# Patient Record
Sex: Female | Born: 1980 | Race: White | Hispanic: No | Smoking: Never smoker
Health system: Southern US, Community
[De-identification: ages and names within clinical notes are randomized; demographics above are authoritative.]

## PROBLEM LIST (undated history)

## (undated) DIAGNOSIS — G43909 Migraine, unspecified, not intractable, without status migrainosus: Secondary | ICD-10-CM

## (undated) HISTORY — PX: WISDOM TOOTH EXTRACTION: SHX21

## (undated) HISTORY — DX: Migraine, unspecified, not intractable, without status migrainosus: G43.909

---
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
## 2001-03-16 ENCOUNTER — Encounter: Payer: Self-pay | Admitting: Emergency Medicine

## 2001-03-16 ENCOUNTER — Emergency Department (HOSPITAL_COMMUNITY): Admission: EM | Admit: 2001-03-16 | Discharge: 2001-03-16 | Payer: Self-pay | Admitting: Emergency Medicine

## 2001-04-17 ENCOUNTER — Other Ambulatory Visit: Admission: RE | Admit: 2001-04-17 | Discharge: 2001-04-17 | Payer: Self-pay | Admitting: Obstetrics and Gynecology

## 2002-05-10 ENCOUNTER — Other Ambulatory Visit: Admission: RE | Admit: 2002-05-10 | Discharge: 2002-05-10 | Payer: Self-pay | Admitting: Obstetrics and Gynecology

## 2003-05-26 ENCOUNTER — Other Ambulatory Visit: Admission: RE | Admit: 2003-05-26 | Discharge: 2003-05-26 | Payer: Self-pay | Admitting: Obstetrics and Gynecology

## 2004-01-11 ENCOUNTER — Emergency Department (HOSPITAL_COMMUNITY): Admission: EM | Admit: 2004-01-11 | Discharge: 2004-01-11 | Payer: Self-pay | Admitting: Family Medicine

## 2004-05-31 ENCOUNTER — Other Ambulatory Visit: Admission: RE | Admit: 2004-05-31 | Discharge: 2004-05-31 | Payer: Self-pay | Admitting: Obstetrics and Gynecology

## 2005-01-26 ENCOUNTER — Encounter: Admission: RE | Admit: 2005-01-26 | Discharge: 2005-01-26 | Payer: Self-pay | Admitting: Family Medicine

## 2005-02-15 ENCOUNTER — Emergency Department (HOSPITAL_COMMUNITY): Admission: EM | Admit: 2005-02-15 | Discharge: 2005-02-15 | Payer: Self-pay | Admitting: Family Medicine

## 2005-06-02 ENCOUNTER — Other Ambulatory Visit: Admission: RE | Admit: 2005-06-02 | Discharge: 2005-06-02 | Payer: Self-pay | Admitting: Obstetrics and Gynecology

## 2008-08-28 ENCOUNTER — Inpatient Hospital Stay (HOSPITAL_COMMUNITY): Admission: AD | Admit: 2008-08-28 | Discharge: 2008-08-31 | Payer: Self-pay | Admitting: Obstetrics and Gynecology

## 2008-08-29 ENCOUNTER — Encounter (INDEPENDENT_AMBULATORY_CARE_PROVIDER_SITE_OTHER): Payer: Self-pay | Admitting: Obstetrics and Gynecology

## 2011-01-04 ENCOUNTER — Other Ambulatory Visit: Payer: Self-pay | Admitting: Obstetrics and Gynecology

## 2011-05-10 ENCOUNTER — Encounter: Payer: Self-pay | Admitting: Nurse Practitioner

## 2011-05-10 ENCOUNTER — Ambulatory Visit (INDEPENDENT_AMBULATORY_CARE_PROVIDER_SITE_OTHER): Payer: Commercial Managed Care - PPO | Admitting: Nurse Practitioner

## 2011-05-10 DIAGNOSIS — G43009 Migraine without aura, not intractable, without status migrainosus: Secondary | ICD-10-CM

## 2011-05-10 MED ORDER — SUMATRIPTAN SUCCINATE 100 MG PO TABS: 100.0000 mg | ORAL_TABLET | Freq: Once | ORAL | Status: DC | PRN

## 2011-05-10 MED ORDER — PROMETHAZINE HCL 25 MG PO TABS: 25.0000 mg | ORAL_TABLET | Freq: Four times a day (QID) | ORAL | Status: DC | PRN

## 2011-05-10 MED ORDER — ONDANSETRON 8 MG PO TBDP: 8.0000 mg | ORAL_TABLET | Freq: Three times a day (TID) | ORAL | Status: DC | PRN

## 2011-05-10 MED ORDER — HYDROCODONE-IBUPROFEN 7.5-200 MG PO TABS: 1.0000 | ORAL_TABLET | Freq: Three times a day (TID) | ORAL | Status: AC | PRN

## 2011-05-10 MED ORDER — ONDANSETRON 8 MG PO TBDP: 8.0000 mg | ORAL_TABLET | Freq: Three times a day (TID) | ORAL | Status: AC | PRN

## 2011-05-10 NOTE — Progress Notes (Signed)
  Subjective:    Patient ID: Gina Morgan A Rash, female    DOB: Jul 05, 1981, 30 y.o.   MRN: [id number]  HPIC/O migraine headache that have worsened in last 4 years. Located in right temple mostly. Has 1 severe every other month, 2 moderates and 2-3 milds per month.  Just using OTC meds now. Last migraine was very severe and she experienced nausea and vomiting and almost went to ER with pain. Currently works as Engineer, civil (consulting) at [employer]. Has 2 yo son. Cleta Alberts IUD for contraception.      Review of Systems  Constitutional: Negative.   Eyes: Negative.   Respiratory: Negative.   Cardiovascular: Negative.   Genitourinary: Negative.   Neurological: Positive for headaches.       Objective:   Physical Exam  Vitals reviewed. Constitutional: She is oriented to person, place, and time. She appears well-developed and well-nourished.  HENT:  Head: Normocephalic and atraumatic.  Eyes: Pupils are equal, round, and reactive to light.  Neck: Normal range of motion.  Cardiovascular: Normal rate and regular rhythm.   Pulmonary/Chest: Breath sounds normal.  Musculoskeletal: Normal range of motion.  Neurological: She is alert and oriented to person, place, and time.  Skin: Skin is warm and dry.  Psychiatric: She has a normal mood and affect. Her behavior is normal. Judgment and thought content normal.          Assessment & Plan:  A: migraine without aura P: She should do very well after migraine education and RX for Imitrex. For nausea zofran and phenergan. Rescue pain meds [facility] RTC one year or prn

## 2011-05-10 NOTE — Progress Notes (Signed)
Complains of frequent headaches

## 2011-06-17 LAB — CBC
HCT: 33.1 % — ABNORMAL LOW (ref 36.0–46.0)
HCT: 33.4 % — ABNORMAL LOW (ref 36.0–46.0)
Hemoglobin: 11.1 g/dL — ABNORMAL LOW (ref 12.0–15.0)
Hemoglobin: 11.1 g/dL — ABNORMAL LOW (ref 12.0–15.0)
MCHC: 33.5 g/dL (ref 30.0–36.0)
MCHC: 33.7 g/dL (ref 30.0–36.0)
MCV: 85.7 fL (ref 78.0–100.0)
MCV: 86.1 fL (ref 78.0–100.0)
Platelets: 229 10*3/uL (ref 150–400)
Platelets: 265 10*3/uL (ref 150–400)
RBC: 3.87 MIL/uL (ref 3.87–5.11)
RDW: 14.5 % (ref 11.5–15.5)
WBC: 10.5 10*3/uL (ref 4.0–10.5)
WBC: 15.2 10*3/uL — ABNORMAL HIGH (ref 4.0–10.5)

## 2011-06-17 LAB — SYPHILIS: RPR W/REFLEX TO RPR TITER AND TREPONEMAL ANTIBODIES, TRADITIONAL SCREENING AND DIAGNOSIS ALGORITHM: RPR Ser Ql: NONREACTIVE

## 2011-09-27 ENCOUNTER — Other Ambulatory Visit (HOSPITAL_COMMUNITY): Payer: Self-pay | Admitting: Family Medicine

## 2011-09-27 DIAGNOSIS — R1013 Epigastric pain: Secondary | ICD-10-CM

## 2011-09-30 ENCOUNTER — Other Ambulatory Visit (HOSPITAL_COMMUNITY): Payer: Commercial Managed Care - PPO

## 2011-10-04 ENCOUNTER — Ambulatory Visit (HOSPITAL_COMMUNITY)
Admission: RE | Admit: 2011-10-04 | Discharge: 2011-10-04 | Disposition: A | Discharge status: Home / Self Care | Payer: Commercial Managed Care - PPO | Source: Ambulatory Visit | Attending: Family Medicine | Admitting: Family Medicine

## 2011-10-04 DIAGNOSIS — R1013 Epigastric pain: Secondary | ICD-10-CM

## 2011-10-04 DIAGNOSIS — R1011 Right upper quadrant pain: Secondary | ICD-10-CM | POA: Insufficient documentation

## 2012-01-05 ENCOUNTER — Other Ambulatory Visit: Payer: Self-pay | Admitting: Obstetrics and Gynecology

## 2012-01-26 ENCOUNTER — Other Ambulatory Visit: Payer: Self-pay | Admitting: *Deleted

## 2012-01-26 DIAGNOSIS — B379 Candidiasis, unspecified: Secondary | ICD-10-CM

## 2012-01-26 MED ORDER — FLUCONAZOLE 150 MG PO TABS: 150.0000 mg | ORAL_TABLET | Freq: Once | ORAL | Status: AC

## 2012-09-27 ENCOUNTER — Other Ambulatory Visit: Payer: Self-pay | Admitting: Medical

## 2012-09-27 DIAGNOSIS — B373 Candidiasis of vulva and vagina: Secondary | ICD-10-CM

## 2012-09-27 MED ORDER — FLUCONAZOLE 150 MG PO TABS: 150.0000 mg | ORAL_TABLET | Freq: Once | ORAL | Status: DC

## 2012-10-05 ENCOUNTER — Telehealth: Payer: Self-pay

## 2012-10-05 NOTE — Telephone Encounter (Signed)
Called in a Rx refill for this patient per Bonita Quin gave her 1 refill and called patient to make an appointment.

## 2012-11-01 ENCOUNTER — Telehealth: Payer: Self-pay | Admitting: *Deleted

## 2012-11-01 DIAGNOSIS — G43009 Migraine without aura, not intractable, without status migrainosus: Secondary | ICD-10-CM

## 2012-11-01 MED ORDER — SUMATRIPTAN SUCCINATE 100 MG PO TABS: 100.0000 mg | ORAL_TABLET | Freq: Once | ORAL | Status: DC | PRN

## 2012-11-01 NOTE — Telephone Encounter (Signed)
Patient needs refill of imitrex, one refill given and patient notified that she needs appointment.

## 2013-07-10 NOTE — Telephone Encounter (Signed)
ERROR

## 2013-09-17 ENCOUNTER — Ambulatory Visit: Payer: Commercial Managed Care - PPO

## 2013-09-17 ENCOUNTER — Other Ambulatory Visit: Payer: Self-pay | Admitting: Obstetrics & Gynecology

## 2013-09-17 DIAGNOSIS — N390 Urinary tract infection, site not specified: Secondary | ICD-10-CM

## 2013-09-17 DIAGNOSIS — R8290 Unspecified abnormal findings in urine: Secondary | ICD-10-CM

## 2013-09-17 LAB — POCT URINALYSIS DIP (DEVICE)
BILIRUBIN URINE: NEGATIVE
Glucose, UA: 100 mg/dL — AB
KETONES UR: NEGATIVE mg/dL
Nitrite: POSITIVE — AB
PH: 6 (ref 5.0–8.0)
PROTEIN: NEGATIVE mg/dL
SPECIFIC GRAVITY, URINE: 1.015 (ref 1.005–1.030)
Urobilinogen, UA: 2 mg/dL — ABNORMAL HIGH (ref 0.0–1.0)

## 2013-09-17 MED ORDER — PHENAZOPYRIDINE HCL 200 MG PO TABS: 200.0000 mg | ORAL_TABLET | Freq: Three times a day (TID) | ORAL | Status: DC

## 2013-09-17 MED ORDER — CEPHALEXIN 500 MG PO CAPS: 500.0000 mg | ORAL_CAPSULE | Freq: Two times a day (BID) | ORAL | Status: DC

## 2013-09-19 LAB — URINE CULTURE
Colony Count: NO GROWTH
Organism ID, Bacteria: NO GROWTH

## 2013-09-20 ENCOUNTER — Other Ambulatory Visit: Payer: Self-pay

## 2013-09-20 DIAGNOSIS — B3731 Acute candidiasis of vulva and vagina: Secondary | ICD-10-CM

## 2013-09-20 DIAGNOSIS — B373 Candidiasis of vulva and vagina: Secondary | ICD-10-CM

## 2013-09-20 MED ORDER — FLUCONAZOLE 150 MG PO TABS: 150.0000 mg | ORAL_TABLET | Freq: Once | ORAL | Status: DC

## 2013-12-03 ENCOUNTER — Other Ambulatory Visit: Payer: Self-pay | Admitting: Obstetrics and Gynecology

## 2014-06-27 ENCOUNTER — Other Ambulatory Visit: Payer: Self-pay

## 2014-07-20 ENCOUNTER — Telehealth: Payer: Self-pay | Admitting: Family

## 2014-07-20 DIAGNOSIS — J019 Acute sinusitis, unspecified: Secondary | ICD-10-CM

## 2014-07-20 MED ORDER — AMOXICILLIN-POT CLAVULANATE 875-125 MG PO TABS: 1.0000 | ORAL_TABLET | Freq: Two times a day (BID) | ORAL | Status: DC

## 2014-07-20 NOTE — Progress Notes (Signed)
We are sorry that you are not feeling well.  Here is how we plan to help!  Based on what you have shared with me it looks like you have sinusitis.  Sinusitis is inflammation and infection in the sinus cavities of the head.  Based on your presentation I believe you most likely have Acute Bacterial sinusitis.  This is an infection caused by bacteria and is treated with antibiotics.  I have prescribed Augmentin, an antibiotic in the penicillin family, one tablet twice daily with food, for 7 days.  You may use an oral decongestant such as Mucinex D or if you have glaucoma or high blood pressure use plain Mucinex.  Saline nasal sprays help an can sefely be used as often as needed for congestion.  If you develop worsening sinus pain, fever or notice severe headache and vision changes, or if symptoms are not better after completion of antibiotic, please schedule an appointment with a health care provider.  Sinus infections are not as easily transmitted as other respiratory infection, howAever we still recommend that you avoid close contact with loved ones, especially the very young and elderly.  Remember to wash your hands thoroughly throughout the day as this is the number one way to prevent the spread of infection!  Home Care:  Only take medications as instructed by your medical team.  Complete the entire course of an antibiotic.  Do not take these medications with alcohol.  A steam or ultrasonic humidifier can help congestion.  You can place a towel over your head and breathe in the steam from hot water coming from a faucet.  Avoid close contacts especially the very young and the elderly.  Cover your mouth when you cough or sneeze.  Always remember to wash your hands.  Get Help Right Away If:  You develop worsening fever or sinus pain.  You develop a severe head ache or visual changes.  Your symptoms persist after you have completed your treatment plan.  Make sure you  Understand these  instructions.  Will watch your condition.  Will get help right away if you are not doing well or get worse.  Your e-visit answers were reviewed by a board certified advanced clinical practitioner to complete your personal care plan.  Depending on the condition, your plan could have included both over the counter or prescription medications.  Please review your pharmacy choice.  If there is a problem, you may call our nursing hot line at [phone] and have the prescription routed to another pharmacy.  Your safety is important to us.  If you have drug allergies check your prescription carefully.    You can use MyChart to ask questions about today's visit, request a non-urgent call back, or ask for a work or school excuse.  You will get an e-mail in the next two days asking about your experience.  I hope that your e-visit has been valuable and will speed your recovery. Thank you for using e-visits.

## 2014-08-28 ENCOUNTER — Other Ambulatory Visit: Payer: Self-pay

## 2014-08-28 MED ORDER — FLUCONAZOLE 150 MG PO TABS: 150.0000 mg | ORAL_TABLET | Freq: Once | ORAL | Status: DC

## 2014-09-22 ENCOUNTER — Encounter: Payer: Self-pay | Admitting: Nurse Practitioner

## 2014-09-22 ENCOUNTER — Ambulatory Visit (INDEPENDENT_AMBULATORY_CARE_PROVIDER_SITE_OTHER): Payer: Commercial Managed Care - PPO | Admitting: Nurse Practitioner

## 2014-09-22 VITALS — BP 114/74 | HR 79 | Temp 98.7°F | Ht 67.0 in | Wt 165.0 lb

## 2014-09-22 DIAGNOSIS — Z309 Encounter for contraceptive management, unspecified: Secondary | ICD-10-CM

## 2014-09-22 DIAGNOSIS — G43109 Migraine with aura, not intractable, without status migrainosus: Secondary | ICD-10-CM

## 2014-09-22 MED ORDER — PROMETHAZINE HCL 25 MG PO TABS: 25.0000 mg | ORAL_TABLET | Freq: Four times a day (QID) | ORAL | Status: DC | PRN

## 2014-09-22 MED ORDER — SUMATRIPTAN SUCCINATE 100 MG PO TABS: 100.0000 mg | ORAL_TABLET | ORAL | Status: DC | PRN

## 2014-09-22 MED ORDER — ONDANSETRON HCL 4 MG PO TABS: 8.0000 mg | ORAL_TABLET | Freq: Three times a day (TID) | ORAL | Status: DC | PRN

## 2014-09-22 MED ORDER — NAPROXEN SODIUM 550 MG PO TABS: 550.0000 mg | ORAL_TABLET | Freq: Two times a day (BID) | ORAL | Status: DC

## 2014-09-22 NOTE — Progress Notes (Signed)
History:  Marchelle Folksmanda A Rash is a 34 y.o. G1P1001 who [facility] follow up with migraine headaches. She has done very well with her migraines and in fact just ran out of her Imitrex from her last visit. She has had her Mirena IUD removed and is now using Nuvaring for birth control. She is planning to become pregnant later this year.   The following portions of the patient's history were reviewed and updated as appropriate: allergies, current medications, past family history, past medical history, past social history, past surgical history and problem list.  Review of Systems:    Objective:  Physical Exam BP 114/74 mmHg  Pulse 79  Temp(Src) 98.7 F (37.1 C) (Oral)  Ht 5\' 7"  (1.702 m)  Wt 165 lb (74.844 kg)  BMI 25.84 kg/m2  LMP 08/22/2014 (Exact Date) GENERAL: Well-developed, well-nourished female in no acute distress.  HEENT: Normocephalic, atraumatic.  NECK: Supple. Normal thyroid.  LUNGS: Normal rate. Clear to auscultation bilaterally.  HEART: Regular rate and rhythm with no adventitious sounds.  EXTREMITIES: No cyanosis, clubbing, or edema, 2+ distal pulses.   Labs and Imaging No results found.  Assessment & Plan:  Assessment:  Migraine with Aura Contraception management  Plans: Refill medications Add Anaprox as needed Advised she should not be using estrogen containing birth control due to aura/ she will advise her OBGYN RTC one year  Delbert PhenixLinda M Tiphany Fayson, NP 09/22/2014 4:38 PM

## 2014-09-22 NOTE — Patient Instructions (Signed)

## 2014-12-09 ENCOUNTER — Other Ambulatory Visit: Payer: Self-pay | Admitting: Obstetrics and Gynecology

## 2014-12-10 LAB — CYTOLOGY - PAP

## 2015-01-12 ENCOUNTER — Emergency Department (HOSPITAL_COMMUNITY)
Admission: EM | Admit: 2015-01-12 | Discharge: 2015-01-12 | Disposition: A | Discharge status: Home / Self Care | Payer: 59 | Source: Home / Self Care | Attending: Family Medicine | Admitting: Family Medicine

## 2015-01-12 ENCOUNTER — Encounter (HOSPITAL_COMMUNITY): Payer: Self-pay

## 2015-01-12 DIAGNOSIS — J020 Streptococcal pharyngitis: Secondary | ICD-10-CM

## 2015-01-12 LAB — POCT RAPID STREP A: Streptococcus, Group A Screen (Direct): POSITIVE — AB

## 2015-01-12 MED ORDER — IBUPROFEN 800 MG PO TABS
800.0000 mg | ORAL_TABLET | Freq: Once | ORAL | Status: AC
  Administered 2015-01-12: 800 mg via ORAL

## 2015-01-12 MED ORDER — AMOXICILLIN 500 MG PO CAPS: 500.0000 mg | ORAL_CAPSULE | Freq: Three times a day (TID) | ORAL | Status: DC

## 2015-01-12 MED ORDER — IBUPROFEN 800 MG PO TABS
ORAL_TABLET | ORAL | Status: AC
  Filled 2015-01-12: qty 1

## 2015-01-12 NOTE — ED Notes (Signed)
C/o fever, chills, ST, cough, body aches since yesterday

## 2015-01-12 NOTE — Discharge Instructions (Signed)

## 2015-01-12 NOTE — ED Provider Notes (Signed)
CSN: [id number]     Arrival date & time 01/12/15  0800 History   First MD Initiated Contact with Patient 01/12/15 [phone]     Chief Complaint  Patient presents with  . Fever   (Consider location/radiation/quality/duration/timing/severity/associated sxs/prior Treatment) HPI Comments: Works at [employer] Recently cared for patient with strep pharyngitis  Patient is a 34 y.o. female presenting with pharyngitis. The history is provided by the patient.  Sore Throat This is a new problem. The current episode started yesterday. The problem occurs constantly. Associated symptoms comments: +fever, myalgias, malaise.    Past Medical History  Diagnosis Date  . Migraine    Past Surgical History  Procedure Laterality Date  . Wisdom tooth extraction     Family History  Problem Relation Age of Onset  . Hypertension Father   . Hypertension Maternal Grandmother   . Heart disease Paternal Grandmother   . Heart attack Paternal Grandmother   . Heart disease Paternal Grandfather   . Cancer Paternal Grandfather     Pancreatic   History  Substance Use Topics  . Smoking status: Never Smoker   . Smokeless tobacco: Never Used  . Alcohol Use: Yes     Comment: rare   OB History    Gravida Para Term Preterm AB TAB SAB Ectopic Multiple Living   0 0 0 0 0 0 1     Review of Systems  All other systems reviewed and are negative.   Allergies  Sulfa antibiotics  Home Medications   Prior to Admission medications   Medication Sig Start Date End Date Taking? Authorizing Provider  amoxicillin (AMOXIL) 500 MG capsule Take 1 capsule (500 mg total) by mouth 3 (three) times daily. 01/12/15   Mathis Fare Ahtziri Jeffries, PA  naproxen sodium (ANAPROX DS) 550 MG tablet Take 1 tablet (550 mg total) by mouth 2 (two) times daily with a meal. 09/22/14   Delbert Phenix, NP  ondansetron (ZOFRAN) 4 MG tablet Take 2 tablets (8 mg total) by mouth every 8 (eight) hours as needed for nausea or vomiting. 09/22/14    Delbert Phenix, NP  promethazine (PHENERGAN) 25 MG tablet Take 1 tablet (25 mg total) by mouth every 6 (six) hours as needed for nausea or vomiting. 09/22/14   Delbert Phenix, NP  SUMAtriptan (IMITREX) 100 MG tablet Take 1 tablet (100 mg total) by mouth as needed for migraine or headache. May repeat in 2 hours if headache persists or recurs. 09/22/14   Delbert Phenix, NP   BP 130/77 mmHg  Pulse 134  Temp(Src) 103 F (39.4 C) (Oral)  Resp 16  SpO2 99% Physical Exam  Constitutional: She is oriented to person, place, and time. She appears well-developed and well-nourished. No distress.  HENT:  Head: Normocephalic and atraumatic.  Right Ear: Hearing, tympanic membrane, external ear and ear canal normal.  Left Ear: Hearing, tympanic membrane, external ear and ear canal normal.  Nose: Nose normal.  Mouth/Throat: Uvula is midline and mucous membranes are normal. No oral lesions. No trismus in the jaw. No uvula swelling. Posterior oropharyngeal erythema present. No oropharyngeal exudate, posterior oropharyngeal edema or tonsillar abscesses.  Eyes: Conjunctivae are normal.  Neck: Normal range of motion. Neck supple.  Cardiovascular: Regular rhythm and normal heart sounds.  Tachycardia present.   Pulmonary/Chest: Effort normal and breath sounds normal. No respiratory distress. She has no wheezes.  Musculoskeletal: Normal range of motion.  Lymphadenopathy:    She has no cervical adenopathy.  Neurological: She is alert and oriented to person, place, and time.  Skin: Skin is warm and dry.  Psychiatric: She has a normal mood and affect. Her behavior is normal.  Nursing note and vitals reviewed.   ED Course  Procedures (including critical care time) Labs Review Labs Reviewed  POCT RAPID STREP A ([facility]ARE ONLY) - Abnormal; Notable for the following:    Streptococcus, Group A Screen (Direct) POSITIVE (*)    All other components within normal limits    Imaging Review No results  found.   MDM   1. Strep pharyngitis   rapid strep positive Amoxicillin as directed. Antipyretics Fluids Rest Follow up prn  Ria ClockJennifer Lee H Sadee Osland, [place] 01/12/15 [phone]

## 2015-07-15 ENCOUNTER — Ambulatory Visit (INDEPENDENT_AMBULATORY_CARE_PROVIDER_SITE_OTHER): Payer: 59 | Admitting: Family

## 2015-07-15 ENCOUNTER — Encounter: Payer: Self-pay | Admitting: Family

## 2015-07-15 VITALS — BP 110/85 | HR 97 | Temp 98.7°F

## 2015-07-15 DIAGNOSIS — Z30433 Encounter for removal and reinsertion of intrauterine contraceptive device: Secondary | ICD-10-CM

## 2015-07-15 DIAGNOSIS — Z30432 Encounter for removal of intrauterine contraceptive device: Secondary | ICD-10-CM | POA: Diagnosis not present

## 2015-07-15 NOTE — Progress Notes (Signed)
   Subjective:    Patient ID: Gina Morgan, female    DOB: 08-Oct-1980, 34 y.o.   MRN: [id number]  HPI Ms. Gina Folksmanda A Morgan is a G1P1001 here with report of getting IUD removed.  No problems or concerns.     Review of Systems Pertinent information in HPI    Objective:   Physical Exam  Constitutional: She is oriented to person, place, and time. She appears well-developed and well-nourished. No distress.  HENT:  Head: Normocephalic and atraumatic.  Neck: Normal range of motion. Neck supple. No thyromegaly present.  Abdominal: Bowel sounds are normal.  Genitourinary:  IUD strings visualized  Neurological: She is alert and oriented to person, place, and time.  Skin: Skin is warm and dry.    Pt consented for IUD removal.  Pt placed in lithotomy position.  IUD removed without difficulty.  Speculum removed.      Assessment & Plan:  IUD Removal  Plan: Follow-up prn  Marlis EdelsonWalidah N Karim, CNM

## 2015-10-22 ENCOUNTER — Other Ambulatory Visit: Payer: Self-pay | Admitting: Obstetrics and Gynecology

## 2015-10-22 DIAGNOSIS — G43109 Migraine with aura, not intractable, without status migrainosus: Secondary | ICD-10-CM

## 2015-10-22 MED ORDER — SUMATRIPTAN SUCCINATE 100 MG PO TABS: 100.0000 mg | ORAL_TABLET | ORAL | Status: DC | PRN

## 2015-10-22 MED FILL — SUMATRIPTAN SUCC 100 MG TAB: 100 | 30 days supply | Qty: 9 | Fill #0

## 2015-10-23 ENCOUNTER — Telehealth: Payer: Self-pay | Admitting: Physician Assistant

## 2015-10-23 DIAGNOSIS — G43109 Migraine with aura, not intractable, without status migrainosus: Secondary | ICD-10-CM

## 2015-10-23 MED ORDER — SUMATRIPTAN SUCCINATE 100 MG PO TABS: 100.0000 mg | ORAL_TABLET | ORAL | Status: DC | PRN

## 2015-10-23 MED ORDER — NAPROXEN 500 MG PO TABS: 500.0000 mg | ORAL_TABLET | Freq: Two times a day (BID) | ORAL | Status: DC

## 2015-10-23 MED FILL — NAPROXEN 500 MG TABLET: 500 | 30 days supply | Qty: 60 | Fill #0

## 2015-10-23 NOTE — Telephone Encounter (Signed)
Pt also requesting Naprosyn.  Sent.

## 2015-10-23 NOTE — Telephone Encounter (Signed)
Pt requesting refill of Imitrex. Will schedule annual appt asap.  Rx sent to [facility] outpatient pharmacy.

## 2015-11-30 ENCOUNTER — Ambulatory Visit (INDEPENDENT_AMBULATORY_CARE_PROVIDER_SITE_OTHER): Payer: 59

## 2015-11-30 DIAGNOSIS — R300 Dysuria: Secondary | ICD-10-CM

## 2015-11-30 LAB — POCT URINALYSIS DIP (DEVICE)
Bilirubin Urine: NEGATIVE
GLUCOSE, UA: NEGATIVE mg/dL
HGB URINE DIPSTICK: NEGATIVE
KETONES UR: NEGATIVE mg/dL
Nitrite: NEGATIVE
PH: 6.5 (ref 5.0–8.0)
PROTEIN: NEGATIVE mg/dL
SPECIFIC GRAVITY, URINE: 1.01 (ref 1.005–1.030)
UROBILINOGEN UA: 0.2 mg/dL (ref 0.0–1.0)

## 2015-11-30 MED ORDER — CIPROFLOXACIN HCL 500 MG PO TABS: 500.0000 mg | ORAL_TABLET | Freq: Two times a day (BID) | ORAL | Status: DC

## 2015-11-30 MED FILL — CIPROFLOXACIN HCL 500 MG TA: 500 | 3 days supply | Qty: 6 | Fill #0

## 2015-11-30 NOTE — Addendum Note (Signed)
Addended by: Cheree DittoGRAHAM, DEMETRICE A on: 11/30/2015 11:57 AM   Modules accepted: Orders

## 2015-11-30 NOTE — Progress Notes (Signed)
Pt comes in for urine check. Urine does shows symptoms of UTI. Cipro has been called into the pharamcy

## 2015-12-01 LAB — URINE CULTURE
COLONY COUNT: NO GROWTH
Organism ID, Bacteria: NO GROWTH

## 2015-12-30 ENCOUNTER — Other Ambulatory Visit: Payer: Self-pay | Admitting: General Practice

## 2015-12-30 DIAGNOSIS — B379 Candidiasis, unspecified: Secondary | ICD-10-CM

## 2015-12-30 MED ORDER — FLUCONAZOLE 150 MG PO TABS
150.0000 mg | ORAL_TABLET | Freq: Once | ORAL | Status: DC
Start: 2015-12-30 — End: 2016-02-04

## 2015-12-30 MED FILL — FLUCONAZOLE 150 MG TABLET: 150 | 1 days supply | Qty: 1 | Fill #0

## 2016-01-06 ENCOUNTER — Ambulatory Visit: Payer: 59 | Admitting: Obstetrics & Gynecology

## 2016-02-04 ENCOUNTER — Encounter: Payer: Self-pay | Admitting: Obstetrics & Gynecology

## 2016-02-04 ENCOUNTER — Ambulatory Visit (INDEPENDENT_AMBULATORY_CARE_PROVIDER_SITE_OTHER): Payer: 59 | Admitting: Obstetrics & Gynecology

## 2016-02-04 VITALS — BP 125/70 | HR 82 | Ht 68.0 in | Wt 172.0 lb

## 2016-02-04 DIAGNOSIS — Z01419 Encounter for gynecological examination (general) (routine) without abnormal findings: Secondary | ICD-10-CM | POA: Diagnosis not present

## 2016-02-04 NOTE — Patient Instructions (Signed)
Preventive Care for Adults, Female A healthy lifestyle and preventive care can promote health and wellness. Preventive health guidelines for women include the following key practices.  A routine yearly physical is a good way to check with your health care provider about your health and preventive screening. It is a chance to share any concerns and updates on your health and to receive a thorough exam.  Visit your dentist for a routine exam and preventive care every 6 months. Brush your teeth twice a day and floss once a day. Good oral hygiene prevents tooth decay and gum disease.  The frequency of eye exams is based on your age, health, family medical history, use of contact lenses, and other factors. Follow your health care provider's recommendations for frequency of eye exams.  Eat a healthy diet. Foods like vegetables, fruits, whole grains, low-fat dairy products, and lean protein foods contain the nutrients you need without too many calories. Decrease your intake of foods high in solid fats, added sugars, and salt. Eat the right amount of calories for you.Get information about a proper diet from your health care provider, if necessary.  Regular physical exercise is one of the most important things you can do for your health. Most adults should get at least 150 minutes of moderate-intensity exercise (any activity that increases your heart rate and causes you to sweat) each week. In addition, most adults need muscle-strengthening exercises on 2 or more days a week.  Maintain a healthy weight. The body mass index (BMI) is a screening tool to identify possible weight problems. It provides an estimate of body fat based on height and weight. Your health care provider can find your BMI and can help you achieve or maintain a healthy weight.For adults 20 years and older:  A BMI below 18.5 is considered underweight.  A BMI of 18.5 to 24.9 is normal.  A BMI of 25 to 29.9 is considered overweight.  A  BMI of 30 and above is considered obese.  Maintain normal blood lipids and cholesterol levels by exercising and minimizing your intake of saturated fat. Eat a balanced diet with plenty of fruit and vegetables. Blood tests for lipids and cholesterol should begin at age 45 and be repeated every 5 years. If your lipid or cholesterol levels are high, you are over 50, or you are at high risk for heart disease, you may need your cholesterol levels checked more frequently.Ongoing high lipid and cholesterol levels should be treated with medicines if diet and exercise are not working.  If you smoke, find out from your health care provider how to quit. If you do not use tobacco, do not start.  Lung cancer screening is recommended for adults aged 45-80 years who are at high risk for developing lung cancer because of a history of smoking. A yearly low-dose CT scan of the lungs is recommended for people who have at least a 30-pack-year history of smoking and are a current smoker or have quit within the past 15 years. A pack year of smoking is smoking an average of 1 pack of cigarettes a day for 1 year (for example: 1 pack a day for 30 years or 2 packs a day for 15 years). Yearly screening should continue until the smoker has stopped smoking for at least 15 years. Yearly screening should be stopped for people who develop a health problem that would prevent them from having lung cancer treatment.  If you are pregnant, do not drink alcohol. If you are  breastfeeding, be very cautious about drinking alcohol. If you are not pregnant and choose to drink alcohol, do not have more than 1 drink per day. One drink is considered to be 12 ounces (355 mL) of beer, 5 ounces (148 mL) of wine, or 1.5 ounces (44 mL) of liquor.  Avoid use of street drugs. Do not share needles with anyone. Ask for help if you need support or instructions about stopping the use of drugs.  High blood pressure causes heart disease and increases the risk  of stroke. Your blood pressure should be checked at least every 1 to 2 years. Ongoing high blood pressure should be treated with medicines if weight loss and exercise do not work.  If you are 55-79 years old, ask your health care provider if you should take aspirin to prevent strokes.  Diabetes screening is done by taking a blood sample to check your blood glucose level after you have not eaten for a certain period of time (fasting). If you are not overweight and you do not have risk factors for diabetes, you should be screened once every 3 years starting at age 45. If you are overweight or obese and you are 40-70 years of age, you should be screened for diabetes every year as part of your cardiovascular risk assessment.  Breast cancer screening is essential preventive care for women. You should practice "breast self-awareness." This means understanding the normal appearance and feel of your breasts and may include breast self-examination. Any changes detected, no matter how small, should be reported to a health care provider. Women in their 20s and 30s should have a clinical breast exam (CBE) by a health care provider as part of a regular health exam every 1 to 3 years. After age 40, women should have a CBE every year. Starting at age 40, women should consider having a mammogram (breast X-ray test) every year. Women who have a family history of breast cancer should talk to their health care provider about genetic screening. Women at a high risk of breast cancer should talk to their health care providers about having an MRI and a mammogram every year.  Breast cancer gene (BRCA)-related cancer risk assessment is recommended for women who have family members with BRCA-related cancers. BRCA-related cancers include breast, ovarian, tubal, and peritoneal cancers. Having family members with these cancers may be associated with an increased risk for harmful changes (mutations) in the breast cancer genes BRCA1 and  BRCA2. Results of the assessment will determine the need for genetic counseling and BRCA1 and BRCA2 testing.  Your health care provider may recommend that you be screened regularly for cancer of the pelvic organs (ovaries, uterus, and vagina). This screening involves a pelvic examination, including checking for microscopic changes to the surface of your cervix (Pap test). You may be encouraged to have this screening done every 3 years, beginning at age 21.  For women ages 30-65, health care providers may recommend pelvic exams and Pap testing every 3 years, or they may recommend the Pap and pelvic exam, combined with testing for human papilloma virus (HPV), every 5 years. Some types of HPV increase your risk of cervical cancer. Testing for HPV may also be done on women of any age with unclear Pap test results.  Other health care providers may not recommend any screening for nonpregnant women who are considered low risk for pelvic cancer and who do not have symptoms. Ask your health care provider if a screening pelvic exam is right for   you.  If you have had past treatment for cervical cancer or a condition that could lead to cancer, you need Pap tests and screening for cancer for at least 20 years after your treatment. If Pap tests have been discontinued, your risk factors (such as having a new sexual partner) need to be reassessed to determine if screening should resume. Some women have medical problems that increase the chance of getting cervical cancer. In these cases, your health care provider may recommend more frequent screening and Pap tests.  Colorectal cancer can be detected and often prevented. Most routine colorectal cancer screening begins at the age of 50 years and continues through age 75 years. However, your health care provider may recommend screening at an earlier age if you have risk factors for colon cancer. On a yearly basis, your health care provider may provide home test kits to check  for hidden blood in the stool. Use of a small camera at the end of a tube, to directly examine the colon (sigmoidoscopy or colonoscopy), can detect the earliest forms of colorectal cancer. Talk to your health care provider about this at age 50, when routine screening begins. Direct exam of the colon should be repeated every 5-10 years through age 75 years, unless early forms of precancerous polyps or small growths are found.  People who are at an increased risk for hepatitis B should be screened for this virus. You are considered at high risk for hepatitis B if:  You were born in a country where hepatitis B occurs often. Talk with your health care provider about which countries are considered high risk.  Your parents were born in a high-risk country and you have not received a shot to protect against hepatitis B (hepatitis B vaccine).  You have HIV or AIDS.  You use needles to inject street drugs.  You live with, or have sex with, someone who has hepatitis B.  You get hemodialysis treatment.  You take certain medicines for conditions like cancer, organ transplantation, and autoimmune conditions.  Hepatitis C blood testing is recommended for all people born from 1945 through 1965 and any individual with known risks for hepatitis C.  Practice safe sex. Use condoms and avoid high-risk sexual practices to reduce the spread of sexually transmitted infections (STIs). STIs include gonorrhea, chlamydia, syphilis, trichomonas, herpes, HPV, and human immunodeficiency virus (HIV). Herpes, HIV, and HPV are viral illnesses that have no cure. They can result in disability, cancer, and death.  You should be screened for sexually transmitted illnesses (STIs) including gonorrhea and chlamydia if:  You are sexually active and are younger than 24 years.  You are older than 24 years and your health care provider tells you that you are at risk for this type of infection.  Your sexual activity has changed  since you were last screened and you are at an increased risk for chlamydia or gonorrhea. Ask your health care provider if you are at risk.  If you are at risk of being infected with HIV, it is recommended that you take a prescription medicine daily to prevent HIV infection. This is called preexposure prophylaxis (PrEP). You are considered at risk if:  You are sexually active and do not regularly use condoms or know the HIV status of your partner(s).  You take drugs by injection.  You are sexually active with a partner who has HIV.  Talk with your health care provider about whether you are at high risk of being infected with HIV. If   you choose to begin PrEP, you should first be tested for HIV. You should then be tested every 3 months for as long as you are taking PrEP.  Osteoporosis is a disease in which the bones lose minerals and strength with aging. This can result in serious bone fractures or breaks. The risk of osteoporosis can be identified using a bone density scan. Women ages 67 years and over and women at risk for fractures or osteoporosis should discuss screening with their health care providers. Ask your health care provider whether you should take a calcium supplement or vitamin D to reduce the rate of osteoporosis.  Menopause can be associated with physical symptoms and risks. Hormone replacement therapy is available to decrease symptoms and risks. You should talk to your health care provider about whether hormone replacement therapy is right for you.  Use sunscreen. Apply sunscreen liberally and repeatedly throughout the day. You should seek shade when your shadow is shorter than you. Protect yourself by wearing long sleeves, pants, a wide-brimmed hat, and sunglasses year round, whenever you are outdoors.  Once a month, do a whole body skin exam, using a mirror to look at the skin on your back. Tell your health care provider of new moles, moles that have irregular borders, moles that  are larger than a pencil eraser, or moles that have changed in shape or color.  Stay current with required vaccines (immunizations).  Influenza vaccine. All adults should be immunized every year.  Tetanus, diphtheria, and acellular pertussis (Td, Tdap) vaccine. Pregnant women should receive 1 dose of Tdap vaccine during each pregnancy. The dose should be obtained regardless of the length of time since the last dose. Immunization is preferred during the 27th-36th week of gestation. An adult who has not previously received Tdap or who does not know her vaccine status should receive 1 dose of Tdap. This initial dose should be followed by tetanus and diphtheria toxoids (Td) booster doses every 10 years. Adults with an unknown or incomplete history of completing a 3-dose immunization series with Td-containing vaccines should begin or complete a primary immunization series including a Tdap dose. Adults should receive a Td booster every 10 years.  Varicella vaccine. An adult without evidence of immunity to varicella should receive 2 doses or a second dose if she has previously received 1 dose. Pregnant females who do not have evidence of immunity should receive the first dose after pregnancy. This first dose should be obtained before leaving the health care facility. The second dose should be obtained 4-8 weeks after the first dose.  Human papillomavirus (HPV) vaccine. Females aged 13-26 years who have not received the vaccine previously should obtain the 3-dose series. The vaccine is not recommended for use in pregnant females. However, pregnancy testing is not needed before receiving a dose. If a female is found to be pregnant after receiving a dose, no treatment is needed. In that case, the remaining doses should be delayed until after the pregnancy. Immunization is recommended for any person with an immunocompromised condition through the age of 61 years if she did not get any or all doses earlier. During the  3-dose series, the second dose should be obtained 4-8 weeks after the first dose. The third dose should be obtained 24 weeks after the first dose and 16 weeks after the second dose.  Zoster vaccine. One dose is recommended for adults aged 30 years or older unless certain conditions are present.  Measles, mumps, and rubella (MMR) vaccine. Adults born  before 1957 generally are considered immune to measles and mumps. Adults born in 1957 or later should have 1 or more doses of MMR vaccine unless there is a contraindication to the vaccine or there is laboratory evidence of immunity to each of the three diseases. A routine second dose of MMR vaccine should be obtained at least 28 days after the first dose for students attending postsecondary schools, health care workers, or international travelers. People who received inactivated measles vaccine or an unknown type of measles vaccine during 1963-1967 should receive 2 doses of MMR vaccine. People who received inactivated mumps vaccine or an unknown type of mumps vaccine before 1979 and are at high risk for mumps infection should consider immunization with 2 doses of MMR vaccine. For females of childbearing age, rubella immunity should be determined. If there is no evidence of immunity, females who are not pregnant should be vaccinated. If there is no evidence of immunity, females who are pregnant should delay immunization until after pregnancy. Unvaccinated health care workers born before 1957 who lack laboratory evidence of measles, mumps, or rubella immunity or laboratory confirmation of disease should consider measles and mumps immunization with 2 doses of MMR vaccine or rubella immunization with 1 dose of MMR vaccine.  Pneumococcal 13-valent conjugate (PCV13) vaccine. When indicated, a person who is uncertain of his immunization history and has no record of immunization should receive the PCV13 vaccine. All adults 65 years of age and older should receive this  vaccine. An adult aged 19 years or older who has certain medical conditions and has not been previously immunized should receive 1 dose of PCV13 vaccine. This PCV13 should be followed with a dose of pneumococcal polysaccharide (PPSV23) vaccine. Adults who are at high risk for pneumococcal disease should obtain the PPSV23 vaccine at least 8 weeks after the dose of PCV13 vaccine. Adults older than 34 years of age who have normal immune system function should obtain the PPSV23 vaccine dose at least 1 year after the dose of PCV13 vaccine.  Pneumococcal polysaccharide (PPSV23) vaccine. When PCV13 is also indicated, PCV13 should be obtained first. All adults aged 65 years and older should be immunized. An adult younger than age 65 years who has certain medical conditions should be immunized. Any person who resides in a nursing home or long-term care facility should be immunized. An adult smoker should be immunized. People with an immunocompromised condition and certain other conditions should receive both PCV13 and PPSV23 vaccines. People with human immunodeficiency virus (HIV) infection should be immunized as soon as possible after diagnosis. Immunization during chemotherapy or radiation therapy should be avoided. Routine use of PPSV23 vaccine is not recommended for American Indians, [place] Natives, or people younger than 65 years unless there are medical conditions that require PPSV23 vaccine. When indicated, people who have unknown immunization and have no record of immunization should receive PPSV23 vaccine. One-time revaccination 5 years after the first dose of PPSV23 is recommended for people aged 19-64 years who have chronic kidney failure, nephrotic syndrome, asplenia, or immunocompromised conditions. People who received 1-2 doses of PPSV23 before age 65 years should receive another dose of PPSV23 vaccine at age 65 years or later if at least 5 years have passed since the previous dose. Doses of PPSV23 are not  needed for people immunized with PPSV23 at or after age 65 years.  Meningococcal vaccine. Adults with asplenia or persistent complement component deficiencies should receive 2 doses of quadrivalent meningococcal conjugate (MenACWY-D) vaccine. The doses should be obtained   at least 2 months apart. Microbiologists working with certain meningococcal bacteria, [place] recruits, people at risk during an outbreak, and people who travel to or live in countries with a high rate of meningitis should be immunized. A first-year college student up through age 34 years who is living in a residence hall should receive a dose if she did not receive a dose on or after her 16th birthday. Adults who have certain high-risk conditions should receive one or more doses of vaccine.  Hepatitis A vaccine. Adults who wish to be protected from this disease, have certain high-risk conditions, work with hepatitis A-infected animals, work in hepatitis A research labs, or travel to or work in countries with a high rate of hepatitis A should be immunized. Adults who were previously unvaccinated and who anticipate close contact with an international adoptee during the first 60 days after arrival in the [place] from a country with a high rate of hepatitis A should be immunized.  Hepatitis B vaccine. Adults who wish to be protected from this disease, have certain high-risk conditions, may be exposed to blood or other infectious body fluids, are household contacts or sex partners of hepatitis B positive people, are clients or workers in certain care facilities, or travel to or work in countries with a high rate of hepatitis B should be immunized.  Haemophilus influenzae type b (Hib) vaccine. A previously unvaccinated person with asplenia or sickle cell disease or having a scheduled splenectomy should receive 1 dose of Hib vaccine. Regardless of previous immunization, a recipient of a hematopoietic stem cell transplant should receive a  3-dose series 6-12 months after her successful transplant. Hib vaccine is not recommended for adults with HIV infection. Preventive Services / Frequency Ages 35 to 4 years  Blood pressure check.** / Every 3-5 years.  Lipid and cholesterol check.** / Every 5 years beginning at age 60.  Clinical breast exam.** / Every 3 years for women in their 71s and 10s.  BRCA-related cancer risk assessment.** / For women who have family members with a BRCA-related cancer (breast, ovarian, tubal, or peritoneal cancers).  Pap test.** / Every 2 years from ages 76 through 26. Every 3 years starting at age 61 through age 76 or 93 with a history of 3 consecutive normal Pap tests.  HPV screening.** / Every 3 years from ages 37 through ages 60 to 51 with a history of 3 consecutive normal Pap tests.  Hepatitis C blood test.** / For any individual with known risks for hepatitis C.  Skin self-exam. / Monthly.  Influenza vaccine. / Every year.  Tetanus, diphtheria, and acellular pertussis (Tdap, Td) vaccine.** / Consult your health care provider. Pregnant women should receive 1 dose of Tdap vaccine during each pregnancy. 1 dose of Td every 10 years.  Varicella vaccine.** / Consult your health care provider. Pregnant females who do not have evidence of immunity should receive the first dose after pregnancy.  HPV vaccine. / 3 doses over 6 months, if 93 and younger. The vaccine is not recommended for use in pregnant females. However, pregnancy testing is not needed before receiving a dose.  Measles, mumps, rubella (MMR) vaccine.** / You need at least 1 dose of MMR if you were born in 1957 or later. You may also need a 2nd dose. For females of childbearing age, rubella immunity should be determined. If there is no evidence of immunity, females who are not pregnant should be vaccinated. If there is no evidence of immunity, females who are  pregnant should delay immunization until after pregnancy.  Pneumococcal  13-valent conjugate (PCV13) vaccine.** / Consult your health care provider.  Pneumococcal polysaccharide (PPSV23) vaccine.** / 1 to 2 doses if you smoke cigarettes or if you have certain conditions.  Meningococcal vaccine.** / 1 dose if you are age 68 to 8 years and a Market researcher living in a residence hall, or have one of several medical conditions, you need to get vaccinated against meningococcal disease. You may also need additional booster doses.  Hepatitis A vaccine.** / Consult your health care provider.  Hepatitis B vaccine.** / Consult your health care provider.  Haemophilus influenzae type b (Hib) vaccine.** / Consult your health care provider. Ages 7 to 53 years  Blood pressure check.** / Every year.  Lipid and cholesterol check.** / Every 5 years beginning at age 25 years.  Lung cancer screening. / Every year if you are aged 11-80 years and have a 30-pack-year history of smoking and currently smoke or have quit within the past 15 years. Yearly screening is stopped once you have quit smoking for at least 15 years or develop a health problem that would prevent you from having lung cancer treatment.  Clinical breast exam.** / Every year after age 48 years.  BRCA-related cancer risk assessment.** / For women who have family members with a BRCA-related cancer (breast, ovarian, tubal, or peritoneal cancers).  Mammogram.** / Every year beginning at age 41 years and continuing for as long as you are in good health. Consult with your health care provider.  Pap test.** / Every 3 years starting at age 65 years through age 37 or 70 years with a history of 3 consecutive normal Pap tests.  HPV screening.** / Every 3 years from ages 72 years through ages 60 to 40 years with a history of 3 consecutive normal Pap tests.  Fecal occult blood test (FOBT) of stool. / Every year beginning at age 21 years and continuing until age 5 years. You may not need to do this test if you get  a colonoscopy every 10 years.  Flexible sigmoidoscopy or colonoscopy.** / Every 5 years for a flexible sigmoidoscopy or every 10 years for a colonoscopy beginning at age 35 years and continuing until age 48 years.  Hepatitis C blood test.** / For all people born from [birth year] through 1965 and any individual with known risks for hepatitis C.  Skin self-exam. / Monthly.  Influenza vaccine. / Every year.  Tetanus, diphtheria, and acellular pertussis (Tdap/Td) vaccine.** / Consult your health care provider. Pregnant women should receive 1 dose of Tdap vaccine during each pregnancy. 1 dose of Td every 10 years.  Varicella vaccine.** / Consult your health care provider. Pregnant females who do not have evidence of immunity should receive the first dose after pregnancy.  Zoster vaccine.** / 1 dose for adults aged 30 years or older.  Measles, mumps, rubella (MMR) vaccine.** / You need at least 1 dose of MMR if you were born in 1957 or later. You may also need a second dose. For females of childbearing age, rubella immunity should be determined. If there is no evidence of immunity, females who are not pregnant should be vaccinated. If there is no evidence of immunity, females who are pregnant should delay immunization until after pregnancy.  Pneumococcal 13-valent conjugate (PCV13) vaccine.** / Consult your health care provider.  Pneumococcal polysaccharide (PPSV23) vaccine.** / 1 to 2 doses if you smoke cigarettes or if you have certain conditions.  Meningococcal vaccine.** /  Consult your health care provider.  Hepatitis A vaccine.** / Consult your health care provider.  Hepatitis B vaccine.** / Consult your health care provider.  Haemophilus influenzae type b (Hib) vaccine.** / Consult your health care provider. Ages 64 years and over  Blood pressure check.** / Every year.  Lipid and cholesterol check.** / Every 5 years beginning at age 23 years.  Lung cancer screening. / Every year if you  are aged 16-80 years and have a 30-pack-year history of smoking and currently smoke or have quit within the past 15 years. Yearly screening is stopped once you have quit smoking for at least 15 years or develop a health problem that would prevent you from having lung cancer treatment.  Clinical breast exam.** / Every year after age 74 years.  BRCA-related cancer risk assessment.** / For women who have family members with a BRCA-related cancer (breast, ovarian, tubal, or peritoneal cancers).  Mammogram.** / Every year beginning at age 44 years and continuing for as long as you are in good health. Consult with your health care provider.  Pap test.** / Every 3 years starting at age 58 years through age 22 or 39 years with 3 consecutive normal Pap tests. Testing can be stopped between 65 and 70 years with 3 consecutive normal Pap tests and no abnormal Pap or HPV tests in the past 10 years.  HPV screening.** / Every 3 years from ages 64 years through ages 70 or 61 years with a history of 3 consecutive normal Pap tests. Testing can be stopped between 65 and 70 years with 3 consecutive normal Pap tests and no abnormal Pap or HPV tests in the past 10 years.  Fecal occult blood test (FOBT) of stool. / Every year beginning at age 40 years and continuing until age 27 years. You may not need to do this test if you get a colonoscopy every 10 years.  Flexible sigmoidoscopy or colonoscopy.** / Every 5 years for a flexible sigmoidoscopy or every 10 years for a colonoscopy beginning at age 7 years and continuing until age 32 years.  Hepatitis C blood test.** / For all people born from [birth year] through 1964 and any individual with known risks for hepatitis C.  Osteoporosis screening.** / A one-time screening for women ages 30 years and over and women at risk for fractures or osteoporosis.  Skin self-exam. / Monthly.  Influenza vaccine. / Every year.  Tetanus, diphtheria, and acellular pertussis (Tdap/Td)  vaccine.** / 1 dose of Td every 10 years.  Varicella vaccine.** / Consult your health care provider.  Zoster vaccine.** / 1 dose for adults aged 35 years or older.  Pneumococcal 13-valent conjugate (PCV13) vaccine.** / Consult your health care provider.  Pneumococcal polysaccharide (PPSV23) vaccine.** / 1 dose for all adults aged 46 years and older.  Meningococcal vaccine.** / Consult your health care provider.  Hepatitis A vaccine.** / Consult your health care provider.  Hepatitis B vaccine.** / Consult your health care provider.  Haemophilus influenzae type b (Hib) vaccine.** / Consult your health care provider. ** Family history and personal history of risk and conditions may change your health care provider's recommendations.   This information is not intended to replace advice given to you by your health care provider. Make sure you discuss any questions you have with your health care provider.   Document Released: 10/25/2001 Document Revised: 09/19/2014 Document Reviewed: 01/24/2011 Elsevier Interactive Patient Education [employer].

## 2016-02-04 NOTE — Progress Notes (Signed)
GYNECOLOGY CLINIC ANNUAL PREVENTATIVE CARE ENCOUNTER NOTE  Subjective:   Gina Morgan is a 35 y.o. [id number] female here for a routine annual gynecologic exam.  Current complaints: none.   Denies abnormal vaginal bleeding, discharge, pelvic pain, problems with intercourse or other gynecologic concerns.    Gynecologic History Patient's last menstrual period was 01/22/2016 (exact date). Contraception: none Last Pap: 12/09/2014. Results were: normal with negative HRHPV   Obstetric History OB History  Gravida Para Term Preterm AB SAB TAB Ectopic Multiple Living  1 1 1  0 0 0 0 0 0 1    # Outcome Date GA Lbr Len/2nd Weight Sex Delivery Anes PTL Lv  1 Term 08/29/08 [id number]   M Vag-Spont EPI  Y      Past Medical History  Diagnosis Date  . Migraine     Past Surgical History  Procedure Laterality Date  . Wisdom tooth extraction      Current Outpatient Prescriptions on File Prior to Visit  Medication Sig Dispense Refill  . ciprofloxacin (CIPRO) 500 MG tablet Take 1 tablet (500 mg total) by mouth 2 (two) times daily. 6 tablet 0  . ciprofloxacin (CIPRO) 500 MG/5ML (10%) suspension Take 500 mg by mouth 2 (two) times daily.    . fluconazole (DIFLUCAN) 150 MG tablet Take 1 tablet (150 mg total) by mouth once. 1 tablet 0  . naproxen (NAPROSYN) 500 MG tablet Take 1 tablet (500 mg total) by mouth 2 (two) times daily with a meal. 60 tablet 0  . SUMAtriptan (IMITREX) 100 MG tablet Take 1 tablet (100 mg total) by mouth as needed for migraine or headache. May repeat in 2 hours if headache persists or recurs. 9 tablet 0   No current facility-administered medications on file prior to visit.    Allergies  Allergen Reactions  . Sulfa Antibiotics Hives    Social History   Social History  . Marital Status: Married    Spouse Name: N/A  . Number of Children: N/A  . Years of Education: N/A   Occupational History  . Not on file.   Social History Main Topics  . Smoking status: Never Smoker    . Smokeless tobacco: Never Used  . Alcohol Use: Yes     Comment: rare  . Drug Use: No  . Sexual Activity: Yes    Birth Control/ Protection: None   Other Topics Concern  . Not on file   Social History Narrative    Family History  Problem Relation Age of Onset  . Hypertension Father   . Hypertension Maternal Grandmother   . Heart disease Paternal Grandmother   . Heart attack Paternal Grandmother   . Heart disease Paternal Grandfather   . Cancer Paternal Grandfather     Pancreatic    The following portions of the patient's history were reviewed and updated as appropriate: allergies, current medications, past family history, past medical history, past social history, past surgical history and problem list.  Review of Systems Pertinent items noted in HPI and remainder of comprehensive ROS otherwise negative.   Objective:  BP 125/70 mmHg  Pulse 82  Ht 5\' 8"  (1.727 m)  Wt 172 lb (78.019 kg)  BMI 26.16 kg/m2  LMP 01/22/2016 (Exact Date) CONSTITUTIONAL: Well-developed, well-nourished female in no acute distress.  HENT:  Normocephalic, atraumatic, External right and left ear normal. Oropharynx is clear and moist EYES: Conjunctivae and EOM are normal. Pupils are equal, round, and reactive to light. No scleral icterus.  NECK: Normal  range of motion, supple, no masses.  Normal thyroid.  SKIN: Skin is warm and dry. No Morgan noted. Not diaphoretic. No erythema. No pallor. NEUROLOGIC: Alert and oriented to person, place, and time. Normal reflexes, muscle tone coordination. No cranial nerve deficit noted. PSYCHIATRIC: Normal mood and affect. Normal behavior. Normal judgment and thought content. CARDIOVASCULAR: Normal heart rate noted, regular rhythm RESPIRATORY: Clear to auscultation bilaterally. Effort and breath sounds normal, no problems with respiration noted. BREASTS: Symmetric in size. No masses, skin changes, nipple drainage, or lymphadenopathy. ABDOMEN: Soft, normal bowel  sounds, no distention noted.  No tenderness, rebound or guarding.  PELVIC: Normal appearing external genitalia; normal appearing vaginal mucosa and cervix.  No abnormal discharge noted.  Normal uterine size, no other palpable masses, no uterine or adnexal tenderness. MUSCULOSKELETAL: Normal range of motion. No tenderness.  No cyanosis, clubbing, or edema.  2+ distal pulses.   Assessment:  Annual gynecologic examination    Plan:  Had normal pap last year, no need for pap this visit Preventative care labs ordered, will follow up results and manage accordingly. Routine preventative health maintenance measures emphasized. Please refer to After Visit Summary for other counseling recommendations.    Jaynie Collins, MD, FACOG Attending Obstetrician & Gynecologist, [facility] and Center for [facility]

## 2016-02-05 DIAGNOSIS — Z01419 Encounter for gynecological examination (general) (routine) without abnormal findings: Secondary | ICD-10-CM | POA: Diagnosis not present

## 2016-02-05 LAB — COMPREHENSIVE METABOLIC PANEL
ALT: 13 U/L (ref 6–29)
AST: 20 U/L (ref 10–30)
Albumin: 4.4 g/dL (ref 3.6–5.1)
Alkaline Phosphatase: 68 U/L (ref 33–115)
BUN: 17 mg/dL (ref 7–25)
CHLORIDE: 102 mmol/L (ref 98–110)
CO2: 27 mmol/L (ref 20–31)
Calcium: 9.5 mg/dL (ref 8.6–10.2)
Creat: 0.62 mg/dL (ref 0.50–1.10)
GLUCOSE: 84 mg/dL (ref 65–99)
POTASSIUM: 4.4 mmol/L (ref 3.5–5.3)
Sodium: 140 mmol/L (ref 135–146)
Total Bilirubin: 0.6 mg/dL (ref 0.2–1.2)
Total Protein: 7.1 g/dL (ref 6.1–8.1)

## 2016-02-05 LAB — HEMOGLOBIN A1C
Hgb A1c MFr Bld: 5.3 % (ref <5.7)
MEAN PLASMA GLUCOSE: 105 mg/dL

## 2016-02-05 LAB — CBC
HEMATOCRIT: 40.6 % (ref 35.0–45.0)
HEMOGLOBIN: 13.4 g/dL (ref 11.7–15.5)
MCH: 28.7 pg (ref 27.0–33.0)
MCHC: 33 g/dL (ref 32.0–36.0)
MCV: 86.9 fL (ref 80.0–100.0)
MPV: 10.8 fL (ref 7.5–12.5)
Platelets: 297 10*3/uL (ref 140–400)
RBC: 4.67 MIL/uL (ref 3.80–5.10)
RDW: 13 % (ref 11.0–15.0)
WBC: 6.4 10*3/uL (ref 3.8–10.8)

## 2016-02-05 LAB — LIPID PANEL
CHOL/HDL RATIO: 2.6 ratio (ref <=5.0)
Cholesterol: 179 mg/dL (ref 125–200)
HDL: 69 mg/dL (ref >=46)
LDL Cholesterol: 94 mg/dL (ref <130)
Triglycerides: 78 mg/dL (ref <150)
VLDL: 16 mg/dL (ref <30)

## 2016-02-05 LAB — T4, FREE: Free T4: 1.2 ng/dL (ref 0.8–1.8)

## 2016-02-05 LAB — TSH: TSH: 1.72 mIU/L

## 2016-02-05 LAB — T3, FREE: T3 FREE: 3 pg/mL (ref 2.3–4.2)

## 2016-02-05 NOTE — Addendum Note (Signed)
Addended by: Kathee DeltonHILLMAN, Gianni Fuchs L on: 02/05/2016 08:09 AM   Modules accepted: Orders

## 2016-02-06 LAB — VITAMIN D 25 HYDROXY (VIT D DEFICIENCY, FRACTURES): Vit D, 25-Hydroxy: 38 ng/mL (ref 30–100)

## 2016-02-07 LAB — VITAMIN D 1,25 DIHYDROXY
VITAMIN D 1, 25 (OH) TOTAL: 44 pg/mL (ref 18–72)
VITAMIN D3 1, 25 (OH): 44 pg/mL
Vitamin D2 1, 25 (OH)2: <8 pg/mL

## 2016-03-25 DIAGNOSIS — H5213 Myopia, bilateral: Secondary | ICD-10-CM | POA: Diagnosis not present

## 2016-06-28 ENCOUNTER — Other Ambulatory Visit (INDEPENDENT_AMBULATORY_CARE_PROVIDER_SITE_OTHER): Payer: 59 | Admitting: General Practice

## 2016-06-28 DIAGNOSIS — R300 Dysuria: Secondary | ICD-10-CM | POA: Diagnosis not present

## 2016-06-28 LAB — POCT URINALYSIS DIP (DEVICE)
Bilirubin Urine: NEGATIVE
GLUCOSE, UA: NEGATIVE mg/dL
Hgb urine dipstick: NEGATIVE
KETONES UR: NEGATIVE mg/dL
Nitrite: NEGATIVE
PROTEIN: NEGATIVE mg/dL
Specific Gravity, Urine: >=1.03 (ref 1.005–1.030)
UROBILINOGEN UA: 0.2 mg/dL (ref 0.0–1.0)
pH: 5.5 (ref 5.0–8.0)

## 2016-08-02 ENCOUNTER — Other Ambulatory Visit: Payer: Self-pay | Admitting: General Practice

## 2016-08-02 DIAGNOSIS — N938 Other specified abnormal uterine and vaginal bleeding: Secondary | ICD-10-CM

## 2016-08-02 DIAGNOSIS — G43109 Migraine with aura, not intractable, without status migrainosus: Secondary | ICD-10-CM

## 2016-08-02 MED ORDER — NORGESTIMATE-ETH ESTRADIOL 0.25-35 MG-MCG PO TABS: 1.0000 | ORAL_TABLET | Freq: Every day | ORAL | 0 refills | Status: DC

## 2016-09-13 ENCOUNTER — Other Ambulatory Visit: Payer: Self-pay | Admitting: Advanced Practice Midwife

## 2016-09-13 MED ORDER — ETONOGESTREL-ETHINYL ESTRADIOL 0.12-0.015 MG/24HR VA RING: VAGINAL_RING | VAGINAL | 12 refills | Status: DC

## 2016-09-13 MED FILL — NUVARING VAGINAL RING: 0.12-0.015 | 84 days supply | Qty: 3 | Fill #0

## 2016-09-20 MED ORDER — SUMATRIPTAN SUCCINATE 100 MG PO TABS: 100.0000 mg | ORAL_TABLET | ORAL | 11 refills | Status: DC | PRN

## 2016-09-20 MED ORDER — NAPROXEN 500 MG PO TABS: 500.0000 mg | ORAL_TABLET | ORAL | 11 refills | Status: DC | PRN

## 2016-12-16 MED FILL — NUVARING VAGINAL RING: 0.12-0.015 | 84 days supply | Qty: 3 | Fill #1

## 2017-03-08 MED FILL — SUMATRIPTAN SUCC 100 MG TAB: 100 | 30 days supply | Qty: 9 | Fill #0

## 2017-03-08 MED FILL — NUVARING VAGINAL RING: 0.12-0.015 | 84 days supply | Qty: 3 | Fill #2

## 2017-06-02 MED FILL — NUVARING VAGINAL RING: 0.12-0.015 | 84 days supply | Qty: 3 | Fill #3

## 2017-06-07 ENCOUNTER — Ambulatory Visit (INDEPENDENT_AMBULATORY_CARE_PROVIDER_SITE_OTHER): Payer: 59 | Admitting: Obstetrics & Gynecology

## 2017-06-07 ENCOUNTER — Encounter: Payer: Self-pay | Admitting: Obstetrics & Gynecology

## 2017-06-07 VITALS — BP 114/67 | HR 75 | Ht 68.0 in | Wt 165.0 lb

## 2017-06-07 DIAGNOSIS — Z1151 Encounter for screening for human papillomavirus (HPV): Secondary | ICD-10-CM | POA: Diagnosis not present

## 2017-06-07 DIAGNOSIS — Z01419 Encounter for gynecological examination (general) (routine) without abnormal findings: Secondary | ICD-10-CM

## 2017-06-07 DIAGNOSIS — Z124 Encounter for screening for malignant neoplasm of cervix: Secondary | ICD-10-CM

## 2017-06-07 DIAGNOSIS — N898 Other specified noninflammatory disorders of vagina: Secondary | ICD-10-CM

## 2017-06-07 NOTE — Patient Instructions (Signed)
Preventive Care 18-39 Years, Female Preventive care refers to lifestyle choices and visits with your health care provider that can promote health and wellness. What does preventive care include?  A yearly physical exam. This is also called an annual well check.  Dental exams once or twice a year.  Routine eye exams. Ask your health care provider how often you should have your eyes checked.  Personal lifestyle choices, including: ? Daily care of your teeth and gums. ? Regular physical activity. ? Eating a healthy diet. ? Avoiding tobacco and drug use. ? Limiting alcohol use. ? Practicing safe sex. ? Taking vitamin and mineral supplements as recommended by your health care provider. What happens during an annual well check? The services and screenings done by your health care provider during your annual well check will depend on your age, overall health, lifestyle risk factors, and family history of disease. Counseling Your health care provider may ask you questions about your:  Alcohol use.  Tobacco use.  Drug use.  Emotional well-being.  Home and relationship well-being.  Sexual activity.  Eating habits.  Work and work Statistician.  Method of birth control.  Menstrual cycle.  Pregnancy history.  Screening You may have the following tests or measurements:  Height, weight, and BMI.  Diabetes screening. This is done by checking your blood sugar (glucose) after you have not eaten for a while (fasting).  Blood pressure.  Lipid and cholesterol levels. These may be checked every 5 years starting at age 66.  Skin check.  Hepatitis C blood test.  Hepatitis B blood test.  Sexually transmitted disease (STD) testing.  BRCA-related cancer screening. This may be done if you have a family history of breast, ovarian, tubal, or peritoneal cancers.  Pelvic exam and Pap test. This may be done every 3 years starting at age 40. Starting at age 59, this may be done every 5  years if you have a Pap test in combination with an HPV test.  Discuss your test results, treatment options, and if necessary, the need for more tests with your health care provider. Vaccines Your health care provider may recommend certain vaccines, such as:  Influenza vaccine. This is recommended every year.  Tetanus, diphtheria, and acellular pertussis (Tdap, Td) vaccine. You may need a Td booster every 10 years.  Varicella vaccine. You may need this if you have not been vaccinated.  HPV vaccine. If you are 69 or younger, you may need three doses over 6 months.  Measles, mumps, and rubella (MMR) vaccine. You may need at least one dose of MMR. You may also need a second dose.  Pneumococcal 13-valent conjugate (PCV13) vaccine. You may need this if you have certain conditions and were not previously vaccinated.  Pneumococcal polysaccharide (PPSV23) vaccine. You may need one or two doses if you smoke cigarettes or if you have certain conditions.  Meningococcal vaccine. One dose is recommended if you are age 27-21 years and a first-year college student living in a residence hall, or if you have one of several medical conditions. You may also need additional booster doses.  Hepatitis A vaccine. You may need this if you have certain conditions or if you travel or work in places where you may be exposed to hepatitis A.  Hepatitis B vaccine. You may need this if you have certain conditions or if you travel or work in places where you may be exposed to hepatitis B.  Haemophilus influenzae type b (Hib) vaccine. You may need this if  you have certain risk factors.  Talk to your health care provider about which screenings and vaccines you need and how often you need them. This information is not intended to replace advice given to you by your health care provider. Make sure you discuss any questions you have with your health care provider. Document Released: 10/25/2001 Document Revised: 05/18/2016  Document Reviewed: 06/30/2015 Elsevier Interactive Patient Education  2017 [employer].

## 2017-06-07 NOTE — Progress Notes (Signed)
GYNECOLOGY ANNUAL PREVENTATIVE CARE ENCOUNTER NOTE  Subjective:   Gina Morgan is a 36 y.o. G21P1001 female here for a routine annual gynecologic exam.  Current complaints: none.   Denies abnormal vaginal bleeding, discharge, pelvic pain, problems with intercourse or other gynecologic concerns.    Gynecologic History Patient's last menstrual period was 05/30/2017 (exact date). Contraception: NuvaRing vaginal inserts Last Pap: 11/2014. Results were: normal and negative HPV  Obstetric History OB History  Gravida Para Term Preterm AB Living  0 0 1  SAB TAB Ectopic Multiple Live Births  0 0 0 0 1    # Outcome Date GA Lbr Len/2nd Weight Sex Delivery Anes PTL Lv  1 Term 08/29/08 [redacted]w[redacted]d   M Vag-Spont EPI  LIV      Past Medical History:  Diagnosis Date  . Migraine     Past Surgical History:  Procedure Laterality Date  . WISDOM TOOTH EXTRACTION      Current Outpatient Prescriptions on File Prior to Visit  Medication Sig Dispense Refill  . etonogestrel-ethinyl estradiol (NUVARING) 0.12-0.015 MG/24HR vaginal ring Insert vaginally and leave in place for 3 consecutive weeks, then remove for 1 week. 1 each 12  . SUMAtriptan (IMITREX) 100 MG tablet Take 1 tablet (100 mg total) by mouth as needed for migraine or headache. May repeat in 2 hours if headache persists or recurs. 9 tablet 11   No current facility-administered medications on file prior to visit.     Allergies  Allergen Reactions  . Sulfa Antibiotics Hives    Social History   Social History  . Marital status: Married    Spouse name: N/A  . Number of children: N/A  . Years of education: N/A   Occupational History  . Not on file.   Social History Main Topics  . Smoking status: Never Smoker  . Smokeless tobacco: Never Used  . Alcohol use Yes     Comment: rare  . Drug use: No  . Sexual activity: Yes    Birth control/ protection: None   Other Topics Concern  . Not on file   Social History Narrative    . No narrative on file    Family History  Problem Relation Age of Onset  . Hypertension Father   . Hypertension Maternal Grandmother   . Heart disease Paternal Grandmother   . Heart attack Paternal Grandmother   . Heart disease Paternal Grandfather   . Cancer Paternal Grandfather        Pancreatic    The following portions of the patient's history were reviewed and updated as appropriate: allergies, current medications, past family history, past medical history, past social history, past surgical history and problem list.  Review of Systems Pertinent items noted in HPI and remainder of comprehensive ROS otherwise negative.   Objective:  BP 114/67   Pulse 75   Ht  (1.727 m)   Wt 165 lb (74.8 kg)   LMP 05/30/2017 (Exact Date)   BMI 25.09 kg/m  CONSTITUTIONAL: Well-developed, well-nourished female in no acute distress.  HENT:  Normocephalic, atraumatic, External right and left ear normal. Oropharynx is clear and moist EYES: Conjunctivae and EOM are normal. Pupils are equal, round, and reactive to light. No scleral icterus.  NECK: Normal range of motion, supple, no masses.  Normal thyroid.  SKIN: Skin is warm and dry. No Morgan noted. Not diaphoretic. No erythema. No pallor. NEUROLOGIC: Alert and oriented to person, place, and time. Normal reflexes, muscle tone  coordination. No cranial nerve deficit noted. PSYCHIATRIC: Normal mood and affect. Normal behavior. Normal judgment and thought content. CARDIOVASCULAR: Normal heart rate noted, regular rhythm RESPIRATORY: Clear to auscultation bilaterally. Effort and breath sounds normal, no problems with respiration noted. BREASTS: Symmetric in size. No masses, skin changes, nipple drainage, or lymphadenopathy. ABDOMEN: Soft, normal bowel sounds, no distention noted.  No tenderness, rebound or guarding.  PELVIC: Normal appearing external genitalia; normal appearing vaginal mucosa and cervix.  Scant white discharge noted.  Pap smear  obtained.  Normal uterine size, no other palpable masses, no uterine or adnexal tenderness. MUSCULOSKELETAL: Normal range of motion. No tenderness.  No cyanosis, clubbing, or edema.  2+ distal pulses.   Assessment and Plan:  1. Well woman exam with routine gynecological exam - Cytology - PAP - Cervicovaginal ancillary only Will follow up results of pap smear and manage accordingly. Routine preventative health maintenance measures emphasized. Please refer to After Visit Summary for other counseling recommendations.    Jaynie Collins, MD, FACOG Attending Obstetrician & Gynecologist, [facility] and Center for [facility]

## 2017-06-08 LAB — CERVICOVAGINAL ANCILLARY ONLY
Bacterial vaginitis: NEGATIVE
Candida vaginitis: NEGATIVE

## 2017-06-09 LAB — CYTOLOGY - PAP
Diagnosis: NEGATIVE
HPV: NOT DETECTED

## 2017-06-16 DIAGNOSIS — H5213 Myopia, bilateral: Secondary | ICD-10-CM | POA: Diagnosis not present

## 2017-06-23 ENCOUNTER — Telehealth: Payer: 59 | Admitting: Family

## 2017-06-23 DIAGNOSIS — Z719 Counseling, unspecified: Secondary | ICD-10-CM

## 2017-06-23 DIAGNOSIS — B373 Candidiasis of vulva and vagina: Secondary | ICD-10-CM

## 2017-06-23 DIAGNOSIS — B9689 Other specified bacterial agents as the cause of diseases classified elsewhere: Secondary | ICD-10-CM

## 2017-06-23 DIAGNOSIS — J329 Chronic sinusitis, unspecified: Secondary | ICD-10-CM | POA: Diagnosis not present

## 2017-06-23 DIAGNOSIS — B3731 Acute candidiasis of vulva and vagina: Secondary | ICD-10-CM

## 2017-06-23 MED ORDER — AMOXICILLIN-POT CLAVULANATE 875-125 MG PO TABS: 1.0000 | ORAL_TABLET | Freq: Two times a day (BID) | ORAL | 0 refills | Status: AC

## 2017-06-23 MED ORDER — FLUCONAZOLE 150 MG PO TABS: 150.0000 mg | ORAL_TABLET | Freq: Once | ORAL | 0 refills | Status: AC

## 2017-06-23 NOTE — Progress Notes (Signed)
Thank you for the details you included in the comment boxes. Those details are very helpful in determining the best course of treatment for you and help [place] to provide the best care. Please do not submit multiple E-Visits; this is confusing for the E-Visit team due to very high volume and change of shifts and may result in duplicate prescriptions or billing encounters.  We are sorry that you are not feeling well.  Here is how we plan to help!  Based on what you have shared with me it looks like you have sinusitis.  Sinusitis is inflammation and infection in the sinus cavities of the head.  Based on your presentation I believe you most likely have Acute Bacterial Sinusitis.  This is an infection caused by bacteria and is treated with antibiotics. I have prescribed Augmentin /125mg  one tablet twice daily with food, for 7 days. I have also sent the Diflucan  once, as you requested.   You may use an oral decongestant such as Mucinex D or if you have glaucoma or high blood pressure use plain Mucinex. Saline nasal spray help and can safely be used as often as needed for congestion.  If you develop worsening sinus pain, fever or notice severe headache and vision changes, or if symptoms are not better after completion of antibiotic, please schedule an appointment with a health care provider.    Sinus infections are not as easily transmitted as other respiratory infection, however we still recommend that you avoid close contact with loved ones, especially the very young and elderly.  Remember to wash your hands thoroughly throughout the day as this is the number one way to prevent the spread of infection!  Home Care:  Only take medications as instructed by your medical team.  Complete the entire course of an antibiotic.  Do not take these medications with alcohol.  A steam or ultrasonic humidifier can help congestion.  You can place a towel over your head and breathe in the steam from hot water  coming from a faucet.  Avoid close contacts especially the very young and the elderly.  Cover your mouth when you cough or sneeze.  Always remember to wash your hands.  Get Help Right Away If:  You develop worsening fever or sinus pain.  You develop a severe head ache or visual changes.  Your symptoms persist after you have completed your treatment plan.  Make sure you  Understand these instructions.  Will watch your condition.  Will get help right away if you are not doing well or get worse.  Your e-visit answers were reviewed by a board certified advanced clinical practitioner to complete your personal care plan.  Depending on the condition, your plan could have included both over the counter or prescription medications.  If there is a problem please reply  once you have received a response from your provider.  Your safety is important to [place].  If you have drug allergies check your prescription carefully.    You can use MyChart to ask questions about today's visit, request a non-urgent call back, or ask for a work or school excuse for 24 hours related to this e-Visit. If it has been greater than 24 hours you will need to follow up with your provider, or enter a new e-Visit to address those concerns.  You will get an e-mail in the next two days asking about your experience.  I hope that your e-visit has been valuable and will speed your recovery. Thank  you for using e-visits.

## 2017-06-23 NOTE — Progress Notes (Signed)
Duplicate E-Visit; do not bill patient.

## 2017-06-29 ENCOUNTER — Telehealth: Payer: Self-pay | Admitting: General Practice

## 2017-06-29 DIAGNOSIS — T3695XA Adverse effect of unspecified systemic antibiotic, initial encounter: Principal | ICD-10-CM

## 2017-06-29 DIAGNOSIS — B379 Candidiasis, unspecified: Secondary | ICD-10-CM

## 2017-06-29 MED ORDER — FLUCONAZOLE 150 MG PO TABS: 150.0000 mg | ORAL_TABLET | Freq: Once | ORAL | 1 refills | Status: AC

## 2017-06-29 NOTE — Telephone Encounter (Signed)
Patient called requesting diflucan due to antibiotic induced yeast infection. Will send in per protocol

## 2017-07-11 ENCOUNTER — Encounter: Payer: Self-pay | Admitting: Obstetrics & Gynecology

## 2017-07-12 ENCOUNTER — Other Ambulatory Visit: Payer: Self-pay | Admitting: *Deleted

## 2017-07-12 ENCOUNTER — Other Ambulatory Visit: Payer: Self-pay | Admitting: Obstetrics & Gynecology

## 2017-07-12 DIAGNOSIS — G43109 Migraine with aura, not intractable, without status migrainosus: Secondary | ICD-10-CM

## 2017-07-12 MED ORDER — PROMETHAZINE HCL 25 MG PO TABS: 25.0000 mg | ORAL_TABLET | Freq: Four times a day (QID) | ORAL | 10 refills | Status: DC | PRN

## 2017-07-12 MED ORDER — PROMETHAZINE HCL 25 MG PO TABS: 25.0000 mg | ORAL_TABLET | Freq: Four times a day (QID) | ORAL | 2 refills | Status: DC | PRN

## 2017-07-12 NOTE — Progress Notes (Signed)
Pt sent message via My Chart and requested refill of phenergan which she uses when she has a migraine. Rx sent to pharmacy as requested.

## 2017-07-31 ENCOUNTER — Telehealth: Payer: 59 | Admitting: Family

## 2017-07-31 DIAGNOSIS — B002 Herpesviral gingivostomatitis and pharyngotonsillitis: Secondary | ICD-10-CM

## 2017-07-31 MED ORDER — VALACYCLOVIR HCL 1 G PO TABS: 2000.0000 mg | ORAL_TABLET | Freq: Two times a day (BID) | ORAL | 0 refills | Status: DC

## 2017-07-31 NOTE — Progress Notes (Signed)
We are sorry that you are not feeling well.  Here is how we plan to help!  Based on what you have shared with me it looks like you have a Cold Sore. A cold sore (fever blister) is a skin infection caused by the herpes simplex virus (HSV-1). HSV-1 is closely related to the virus that causes genital herpes (HSV-2), but they are not the same even though both viruses can cause oral and genital infections. Cold sores are small, fluid-filled sores inside of the mouth or on the lips, gums, nose, chin, cheeks, or fingers.   The herpes simplex virus can be easily passed (contagious) to other people through close personal contact, such as kissing or sharing personal items. The virus can also spread to other parts of the body, such as the eyes or genitals. Cold sores are contagious until the sores crust over completely. They often heal within 2 weeks. Once a person is infected, the herpes simplex virus remains permanently in the body. Therefore, there is no cure for cold sores, and they often recur when a person is tired, stressed, sick, or gets too much sun. Additional factors that can cause a recurrence include hormone changes in menstruation or pregnancy, certain drugs, and cold weather.  CAUSES  Cold sores are caused by the herpes simplex virus. The virus is spread from person to person through close contact, such as through kissing, touching the affected area, or sharing personal items such as lip balm, razors, or eating utensils.   I have sent in a prescription to your pharmacy of Valacyclovir, or Valtrex, 2000 mg twice a day for 1 day.   HOME CARE INSTRUCTIONS   Only take over-the-counter or prescription medicines for pain, discomfort, or fever as directed by your caregiver. Do not use aspirin.   Use a cotton-tip swab to apply creams or gels to your sores.   Do not touch the sores or pick the scabs. Wash your hands often. Do not touch your eyes without washing your hands first.   Avoid kissing, oral  sex, and sharing personal items until sores heal.   Apply an ice pack on your sores for 10-15 minutes to ease any discomfort.   Avoid hot, cold, or salty foods because they may hurt your mouth. Eat a soft, bland diet to avoid irritating the sores. Use a straw to drink if you have pain when drinking out of a glass.   Keep sores clean and dry to prevent an infection of other tissues.   Avoid the sun and limit stress if these things trigger outbreaks. If sun causes cold sores, apply sunscreen on the lips before being out in the sun.  SEEK MEDICAL CARE IF:   You have a fever or persistent symptoms for more than 2-3 days.   You have a fever and your symptoms suddenly get worse.   You have pus, not clear fluid, coming from the sores.   You have redness that is spreading.   You have pain or irritation in your eye.   You get sores on your genitals.   Your sores do not heal within 2 weeks.   You have a weakened immune system.   You have frequent recurrences of cold sores.    MAKE SURE YOU   Understand these instructions.  Will watch your condition.  Will get help right away if you are not doing well or get worse.  Your e-visit answers were reviewed by a board certified advanced clinical practitioner to complete your   personal care plan.  Depending on the condition, your plan could have included both over the counter or prescription medications.  If there is a problem please reply  once you have received a response from your provider.  Your safety is important to us.  If you have drug allergies check your prescription carefully.    You can use MyChart to ask questions about today's visit, request a non-urgent call back, or ask for a work or school excuse.  You will get an e-mail in the next two days asking about your experience.  I hope that your e-visit has been valuable and will speed your recovery. Thank you for using e-visits.     

## 2017-08-14 ENCOUNTER — Other Ambulatory Visit: Payer: Self-pay | Admitting: General Practice

## 2017-08-14 DIAGNOSIS — Z3044 Encounter for surveillance of vaginal ring hormonal contraceptive device: Secondary | ICD-10-CM

## 2017-08-14 MED ORDER — ETONOGESTREL-ETHINYL ESTRADIOL 0.12-0.015 MG/24HR VA RING: VAGINAL_RING | VAGINAL | 12 refills | Status: DC

## 2017-10-03 ENCOUNTER — Encounter: Payer: Self-pay | Admitting: Obstetrics & Gynecology

## 2017-10-03 DIAGNOSIS — G43109 Migraine with aura, not intractable, without status migrainosus: Secondary | ICD-10-CM

## 2017-10-04 MED ORDER — SUMATRIPTAN SUCCINATE 100 MG PO TABS: 100.0000 mg | ORAL_TABLET | ORAL | 11 refills | Status: DC | PRN

## 2017-10-04 MED ORDER — NAPROXEN 500 MG PO TABS: 500.0000 mg | ORAL_TABLET | Freq: Two times a day (BID) | ORAL | 7 refills | Status: DC

## 2017-12-01 ENCOUNTER — Encounter: Payer: Self-pay | Admitting: Obstetrics & Gynecology

## 2017-12-01 ENCOUNTER — Ambulatory Visit (INDEPENDENT_AMBULATORY_CARE_PROVIDER_SITE_OTHER): Payer: 59 | Admitting: Obstetrics & Gynecology

## 2017-12-01 VITALS — BP 113/73 | HR 82 | Ht 67.0 in | Wt 165.0 lb

## 2017-12-01 DIAGNOSIS — Z3202 Encounter for pregnancy test, result negative: Secondary | ICD-10-CM

## 2017-12-01 DIAGNOSIS — Z30430 Encounter for insertion of intrauterine contraceptive device: Secondary | ICD-10-CM

## 2017-12-01 DIAGNOSIS — Z01812 Encounter for preprocedural laboratory examination: Secondary | ICD-10-CM

## 2017-12-01 LAB — POCT PREGNANCY, URINE: PREG TEST UR: NEGATIVE

## 2017-12-01 MED ORDER — LEVONORGESTREL 20 MCG/24HR IU IUD
INTRAUTERINE_SYSTEM | Freq: Once | INTRAUTERINE | Status: AC
  Administered 2017-12-01: 1 via INTRAUTERINE

## 2017-12-01 NOTE — Progress Notes (Signed)
    GYNECOLOGY OFFICE PROCEDURE NOTE  Gina Morgan is a 37 y.o. G1P1001 here for Mirena IUD insertion. No GYN concerns.  Last pap smear was on 06/07/2017 and was normal with negative HRHPV  IUD Insertion Procedure Note Patient identified, informed consent performed, consent signed.   Discussed risks of irregular bleeding, cramping, infection, malpositioning or misplacement of the IUD outside the uterus which may require further procedure such as laparoscopy. Time out was performed.  Urine pregnancy test negative.  Speculum placed in the vagina.  Cervix visualized.  Cleaned with Betadine x 2.  Grasped anteriorly with a single tooth tenaculum.  Uterus was unable to be sounded secondary to cervical stenosis.   Paracervical block given with 2% lidocaine with epinephrine. Dilators used to breach the stenosis, uterus sounded to 9 cm.  Mirena IUD placed per manufacturer's recommendations.  Strings trimmed to 3 cm. Tenaculum was removed, good hemostasis noted.  Patient tolerated procedure well.   Patient was given post-procedure instructions.  She was advised to have backup contraception for one week.  Patient was also asked to check IUD strings periodically and to call for any concerns.    Jaynie CollinsUGONNA  ANYANWU, MD, FACOG Obstetrician & Gynecologist, [facility], [facility]

## 2017-12-01 NOTE — Patient Instructions (Signed)
IUD PLACEMENT POST-PROCEDURE INSTRUCTIONS  1. You may take Ibuprofen, Aleve or Tylenol for pain if needed.  Cramping should resolve within in 24 hours.  2. You may have a small amount of spotting.  You should wear a mini pad for the next few days.  3. You may have intercourse after 24 hours.  If you using this for birth control, it is effective in one week, use back up.  4. You need to call if you have any pelvic pain, fever, heavy bleeding or foul smelling vaginal discharge.  Irregular bleeding is common the first several months after having an IUD placed. You do not need to call for this reason unless you are concerned.  5. Shower or bathe as normal  6. Please call if you are having any problems.

## 2017-12-04 ENCOUNTER — Encounter: Payer: Self-pay | Admitting: *Deleted

## 2018-01-23 ENCOUNTER — Telehealth: Payer: 59 | Admitting: Physician Assistant

## 2018-01-23 DIAGNOSIS — N3000 Acute cystitis without hematuria: Secondary | ICD-10-CM

## 2018-01-23 MED ORDER — NITROFURANTOIN MONOHYD MACRO 100 MG PO CAPS: 100.0000 mg | ORAL_CAPSULE | Freq: Two times a day (BID) | ORAL | 0 refills | Status: AC

## 2018-01-23 NOTE — Progress Notes (Signed)

## 2018-02-07 ENCOUNTER — Other Ambulatory Visit: Payer: Self-pay | Admitting: General Practice

## 2018-02-07 DIAGNOSIS — Z0000 Encounter for general adult medical examination without abnormal findings: Secondary | ICD-10-CM

## 2018-02-07 DIAGNOSIS — IMO0001 Reserved for inherently not codable concepts without codable children: Secondary | ICD-10-CM

## 2018-02-07 MED ORDER — CAYA VA DPRH: 1.0000 | VAGINAL_INSERT | VAGINAL | 0 refills | Status: DC | PRN

## 2018-02-16 ENCOUNTER — Other Ambulatory Visit: Payer: 59

## 2018-02-16 DIAGNOSIS — Z0000 Encounter for general adult medical examination without abnormal findings: Secondary | ICD-10-CM

## 2018-02-21 LAB — COMPREHENSIVE METABOLIC PANEL
A/G RATIO: 1.7 (ref 1.2–2.2)
ALBUMIN: 4.7 g/dL (ref 3.5–5.5)
ALK PHOS: 74 IU/L (ref 39–117)
ALT: 16 IU/L (ref 0–32)
AST: 21 IU/L (ref 0–40)
BUN / CREAT RATIO: 22 (ref 9–23)
BUN: 17 mg/dL (ref 6–20)
Bilirubin Total: 0.6 mg/dL (ref 0.0–1.2)
CHLORIDE: 103 mmol/L (ref 96–106)
CO2: 24 mmol/L (ref 20–29)
Calcium: 9.8 mg/dL (ref 8.7–10.2)
Creatinine, Ser: 0.76 mg/dL (ref 0.57–1.00)
GFR calc Af Amer: 118 mL/min/{1.73_m2} (ref >59)
GFR calc non Af Amer: 102 mL/min/{1.73_m2} (ref >59)
GLOBULIN, TOTAL: 2.8 g/dL (ref 1.5–4.5)
Glucose: 77 mg/dL (ref 65–99)
Potassium: 4.6 mmol/L (ref 3.5–5.2)
SODIUM: 141 mmol/L (ref 134–144)
Total Protein: 7.5 g/dL (ref 6.0–8.5)

## 2018-02-21 LAB — CBC
Hematocrit: 42.3 % (ref 34.0–46.6)
Hemoglobin: 13.4 g/dL (ref 11.1–15.9)
MCH: 28 pg (ref 26.6–33.0)
MCHC: 31.7 g/dL (ref 31.5–35.7)
MCV: 88 fL (ref 79–97)
Platelets: 339 10*3/uL (ref 150–450)
RBC: 4.79 x10E6/uL (ref 3.77–5.28)
RDW: 13.8 % (ref 12.3–15.4)
WBC: 4.2 10*3/uL (ref 3.4–10.8)

## 2018-02-21 LAB — T3, REVERSE: Reverse T3, Serum: 20.9 ng/dL (ref 9.2–24.1)

## 2018-02-21 LAB — T4, FREE: Free T4: 1.34 ng/dL (ref 0.82–1.77)

## 2018-02-21 LAB — T3, FREE: T3 FREE: 3.2 pg/mL (ref 2.0–4.4)

## 2018-02-21 LAB — LIPID PANEL
CHOL/HDL RATIO: 2.5 ratio (ref 0.0–4.4)
Cholesterol, Total: 191 mg/dL (ref 100–199)
HDL: 76 mg/dL (ref >39)
LDL CALC: 108 mg/dL — AB (ref 0–99)
Triglycerides: 37 mg/dL (ref 0–149)
VLDL CHOLESTEROL CAL: 7 mg/dL (ref 5–40)

## 2018-02-21 LAB — TSH: TSH: 0.988 u[IU]/mL (ref 0.450–4.500)

## 2018-02-21 LAB — VITAMIN D 1,25 DIHYDROXY
VITAMIN D 1, 25 (OH) TOTAL: 40 pg/mL
VITAMIN D3 1, 25 (OH): 38 pg/mL
Vitamin D2 1, 25 (OH)2: <10 pg/mL

## 2018-02-21 LAB — HEMOGLOBIN A1C
Est. average glucose Bld gHb Est-mCnc: 103 mg/dL
HEMOGLOBIN A1C: 5.2 % (ref 4.8–5.6)

## 2018-04-09 DIAGNOSIS — R000 Tachycardia, unspecified: Secondary | ICD-10-CM | POA: Diagnosis not present

## 2018-04-09 DIAGNOSIS — I491 Atrial premature depolarization: Secondary | ICD-10-CM | POA: Diagnosis not present

## 2018-04-09 DIAGNOSIS — R0602 Shortness of breath: Secondary | ICD-10-CM | POA: Diagnosis not present

## 2018-05-02 ENCOUNTER — Telehealth: Payer: 59 | Admitting: Nurse Practitioner

## 2018-05-02 DIAGNOSIS — M546 Pain in thoracic spine: Secondary | ICD-10-CM

## 2018-05-02 MED ORDER — CYCLOBENZAPRINE HCL 10 MG PO TABS: 10.0000 mg | ORAL_TABLET | Freq: Three times a day (TID) | ORAL | 1 refills | Status: DC | PRN

## 2018-05-02 MED ORDER — NAPROXEN 500 MG PO TABS: 500.0000 mg | ORAL_TABLET | Freq: Two times a day (BID) | ORAL | 1 refills | Status: DC

## 2018-05-02 NOTE — Progress Notes (Signed)

## 2018-05-25 DIAGNOSIS — Z309 Encounter for contraceptive management, unspecified: Secondary | ICD-10-CM | POA: Diagnosis not present

## 2018-05-25 DIAGNOSIS — Z30012 Encounter for prescription of emergency contraception: Secondary | ICD-10-CM | POA: Diagnosis not present

## 2018-05-25 DIAGNOSIS — Z3041 Encounter for surveillance of contraceptive pills: Secondary | ICD-10-CM | POA: Diagnosis not present

## 2018-06-11 ENCOUNTER — Other Ambulatory Visit: Payer: Self-pay

## 2018-06-11 ENCOUNTER — Encounter: Payer: Self-pay | Admitting: Adult Health

## 2018-06-11 ENCOUNTER — Ambulatory Visit (INDEPENDENT_AMBULATORY_CARE_PROVIDER_SITE_OTHER): Payer: Self-pay | Admitting: Adult Health

## 2018-06-11 VITALS — BP 113/72 | HR 85 | Ht 67.0 in | Wt 158.0 lb

## 2018-06-11 DIAGNOSIS — Z113 Encounter for screening for infections with a predominantly sexual mode of transmission: Secondary | ICD-10-CM

## 2018-06-11 DIAGNOSIS — F329 Major depressive disorder, single episode, unspecified: Secondary | ICD-10-CM

## 2018-06-11 MED ORDER — ESCITALOPRAM OXALATE 10 MG PO TABS: 10.0000 mg | ORAL_TABLET | Freq: Every day | ORAL | 6 refills | Status: DC

## 2018-06-11 MED ORDER — LORAZEPAM 0.5 MG PO TABS: 0.5000 mg | ORAL_TABLET | Freq: Three times a day (TID) | ORAL | 0 refills | Status: DC | PRN

## 2018-06-11 MED FILL — LORazepam 0.5 MG TABS: 0.5 | 10 days supply | Qty: 30 | Fill #0

## 2018-06-11 MED FILL — ESCITALOPRAM 10 MG TABLET: 10 | 30 days supply | Qty: 30 | Fill #0

## 2018-06-11 NOTE — Progress Notes (Signed)
  Subjective:     Patient ID: Gina Morgan A Rash, female   DOB: 1981/06/15, 37 y.o.   MRN: [id number]  HPI Gina Morgan is a 37 year old white female, married, in requesting STD testing and is depressed.Her husband told her this weekend he had an affair.   Review of Systems +teary +depression Can't eat much or sleep Reviewed past medical,surgical, social and family history. Reviewed medications and allergies.     Objective:   Physical Exam BP 113/72 (BP Location: Right Arm, Patient Position: Sitting, Cuff Size: Normal)   Pulse 85   Ht 5\' 7"  (1.702 m)   Wt 158 lb (71.7 kg)   LMP 06/03/2018   BMI 24.75 kg/m   Skin warm and dry.Pelvic: external genitalia is normal in appearance no lesions, vagina: pink with good moisture and rugae,urethra has no lesions or masses noted, cervix:smooth and bulbous, uterus: normal size, shape and contour, non tender, no masses felt, adnexa: no masses or tenderness noted. Bladder is non tender and no masses felt.  Nuswab obtained. PHQ 9 score 21, denies being suicidal, and is open to meds, she is going to see counselor, and has talked with pastor. Will rx lexapro and also give ativan for sleep.    Assessment:     1. Reactive depression   2. Screening examination for STD (sexually transmitted disease)       Plan:     Nuswab sent Meds ordered this encounter  Medications  . escitalopram (LEXAPRO) 10 MG tablet    Sig: Take 1 tablet (10 mg total) by mouth daily.    Dispense:  30 tablet    Refill:  6    Order Specific Question:   Supervising Provider    Answer:   Despina Hidden, LUTHER H [[id number]]  . LORazepam (ATIVAN) 0.5 MG tablet    Sig: Take 1 tablet (0.5 mg total) by mouth every 8 (eight) hours as needed for anxiety.    Dispense:  30 tablet    Refill:  0    Order Specific Question:   Supervising Provider    Answer:   Despina Hidden, LUTHER H [[id number]]  F/U in 5 weeks or sooner if needed

## 2018-06-14 LAB — NUSWAB VAGINITIS PLUS (VG+)
Candida albicans, NAA: NEGATIVE
Candida glabrata, NAA: NEGATIVE
Chlamydia trachomatis, NAA: NEGATIVE
Neisseria gonorrhoeae, NAA: NEGATIVE
Trich vag by NAA: NEGATIVE

## 2018-07-09 MED FILL — ESCITALOPRAM 10 MG TABLET: 10 | 30 days supply | Qty: 30 | Fill #1

## 2018-07-16 ENCOUNTER — Encounter: Payer: Self-pay | Admitting: Adult Health

## 2018-07-16 ENCOUNTER — Ambulatory Visit (INDEPENDENT_AMBULATORY_CARE_PROVIDER_SITE_OTHER): Payer: 59 | Admitting: Adult Health

## 2018-07-16 VITALS — BP 126/81 | HR 96 | Ht 67.0 in | Wt 157.5 lb

## 2018-07-16 DIAGNOSIS — F329 Major depressive disorder, single episode, unspecified: Secondary | ICD-10-CM | POA: Diagnosis not present

## 2018-07-16 NOTE — Progress Notes (Signed)
  Subjective:     Patient ID: Marchelle Folks A Rash, female   DOB: 05/29/1981, 37 y.o.   MRN: [id number]  HPI Aerilyn is a 37 year old white female back in follow up on starting lexapro and doing better. She is separating.   Review of Systems Sleeping Ok takes unisom at times Not as anxious Weight stable  Reviewed past medical,surgical, social and family history. Reviewed medications and allergies.     Objective:   Physical Exam BP 126/81 (BP Location: Left Arm, Patient Position: Sitting, Cuff Size: Normal)   Pulse 96   Ht 5\' 7"  (1.702 m)   Wt 157 lb 8 oz (71.4 kg)   LMP 07/05/2018   BMI 24.67 kg/m  Talk only: PHQ 9 score 9, which is down from 21 on 06/11/18,she denies being suicidal, but is getting ready to move, so can increase lexapro to 20 mg and see if this is better, just let me know.    Assessment:     1. Reactive depression       Plan:     Continue lexapro, can take 2 10 mg to equal 20 mg and let me know, so I can redo Rx F/U in 3 months

## 2018-07-20 ENCOUNTER — Other Ambulatory Visit: Payer: Self-pay | Admitting: Adult Health

## 2018-07-20 MED ORDER — ESCITALOPRAM OXALATE 20 MG PO TABS: 20.0000 mg | ORAL_TABLET | Freq: Every day | ORAL | 3 refills | Status: DC

## 2018-07-20 MED FILL — ESCITALOPRAM 20 MG TABLET: 20 | 90 days supply | Qty: 90 | Fill #0

## 2018-07-20 NOTE — Progress Notes (Signed)
Doing well on 20 mg of lexapro will rx

## 2018-08-01 ENCOUNTER — Other Ambulatory Visit: Payer: Self-pay | Admitting: Adult Health

## 2018-08-01 MED ORDER — LORAZEPAM 0.5 MG PO TABS: 0.5000 mg | ORAL_TABLET | Freq: Three times a day (TID) | ORAL | 0 refills | Status: DC

## 2018-08-01 NOTE — Progress Notes (Signed)
Pt lost bottle when moved, and requests refill

## 2018-09-13 ENCOUNTER — Telehealth: Payer: 59 | Admitting: Family

## 2018-09-13 DIAGNOSIS — B9789 Other viral agents as the cause of diseases classified elsewhere: Secondary | ICD-10-CM | POA: Diagnosis not present

## 2018-09-13 DIAGNOSIS — J329 Chronic sinusitis, unspecified: Secondary | ICD-10-CM

## 2018-09-13 NOTE — Progress Notes (Signed)
Thank you for the details you included in the comment boxes. Those details are very helpful in determining the best course of treatment for you and help us to provide the best care.  We are sorry that you are not feeling well.  Here is how we plan to help!  Based on what you have shared with me it looks like you have sinusitis.  Sinusitis is inflammation and infection in the sinus cavities of the head.  Based on your presentation I believe you most likely have Acute Viral Sinusitis.This is an infection most likely caused by a virus. There is not specific treatment for viral sinusitis other than to help you with the symptoms until the infection runs its course.  You may use an oral decongestant such as Mucinex D or if you have glaucoma or high blood pressure use plain Mucinex. Saline nasal spray help and can safely be used as often as needed for congestion, I have prescribed: Fluticasone nasal spray two sprays in each nostril once a day  Some authorities believe that zinc sprays or the use of Echinacea may shorten the course of your symptoms.  Sinus infections are not as easily transmitted as other respiratory infection, however we still recommend that you avoid close contact with loved ones, especially the very young and elderly.  Remember to wash your hands thoroughly throughout the day as this is the number one way to prevent the spread of infection!  Home Care:  Only take medications as instructed by your medical team.  Do not take these medications with alcohol.  A steam or ultrasonic humidifier can help congestion.  You can place a towel over your head and breathe in the steam from hot water coming from a faucet.  Avoid close contacts especially the very young and the elderly.  Cover your mouth when you cough or sneeze.  Always remember to wash your hands.  Get Help Right Away If:  You develop worsening fever or sinus pain.  You develop a severe head ache or visual changes.  Your  symptoms persist after you have completed your treatment plan.  Make sure you  Understand these instructions.  Will watch your condition.  Will get help right away if you are not doing well or get worse.  Your e-visit answers were reviewed by a board certified advanced clinical practitioner to complete your personal care plan.  Depending on the condition, your plan could have included both over the counter or prescription medications.  If there is a problem please reply  once you have received a response from your provider.  Your safety is important to us.  If you have drug allergies check your prescription carefully.    You can use MyChart to ask questions about today's visit, request a non-urgent call back, or ask for a work or school excuse for 24 hours related to this e-Visit. If it has been greater than 24 hours you will need to follow up with your provider, or enter a new e-Visit to address those concerns.  You will get an e-mail in the next two days asking about your experience.  I hope that your e-visit has been valuable and will speed your recovery. Thank you for using e-visits.    

## 2018-11-02 ENCOUNTER — Encounter: Payer: Self-pay | Admitting: Adult Health

## 2018-11-02 ENCOUNTER — Ambulatory Visit (INDEPENDENT_AMBULATORY_CARE_PROVIDER_SITE_OTHER): Payer: 59 | Admitting: Adult Health

## 2018-11-02 VITALS — BP 113/67 | HR 82 | Ht 67.0 in | Wt 163.0 lb

## 2018-11-02 DIAGNOSIS — F329 Major depressive disorder, single episode, unspecified: Secondary | ICD-10-CM

## 2018-11-02 MED ORDER — SERTRALINE HCL 50 MG PO TABS: 50.0000 mg | ORAL_TABLET | Freq: Every day | ORAL | 4 refills | Status: DC

## 2018-11-02 MED ORDER — LORAZEPAM 0.5 MG PO TABS: 0.5000 mg | ORAL_TABLET | Freq: Three times a day (TID) | ORAL | 0 refills | Status: DC

## 2018-11-02 MED FILL — LORazepam 0.5 MG TABS: 0.5 | 20 days supply | Qty: 60 | Fill #0

## 2018-11-02 MED FILL — SERTRALINE HCL 50 MG TABLET: 50 | 90 days supply | Qty: 90 | Fill #0

## 2018-11-02 NOTE — Progress Notes (Signed)
Patient ID: Gina Morgan, female   DOB: June 21, 1981, 38 y.o.   MRN: [id number] History of Present Illness:  Gina Morgan is a 38 year old white female, separated, G1P1, back in follow up on lexapro, and is sweating at night, feeling more blah and easily irritated and not sleeping as well.   Current Medications, Allergies, Past Medical History, Past Surgical History, Family History and Social History were reviewed in [employer] record.     Review of Systems: Sweating at night Feels more blah Easily irritated Not sleeping as well    Physical Exam:BP 113/67 (BP Location: Right Arm, Patient Position: Sitting, Cuff Size: Normal)   Pulse 82   Ht 5\' 7"  (1.702 m)   Wt 163 lb (73.9 kg)   LMP 10/29/2018 (Exact Date)   BMI 25.53 kg/m  General:  Well developed, well nourished, no acute distress Skin:  Warm and dry Psych:  No mood changes, alert and cooperative,seems happy Fall risk is low. PHQ 9 score is 11, denies being suicidal, she is on lexapro, will stop that and start Zoloft, to see if better. She requests refill on ativan.   Impression:  1. Reactive depression      Plan: Meds ordered this encounter  Medications  . sertraline (ZOLOFT) 50 MG tablet    Sig: Take 1 tablet (50 mg total) by mouth daily.    Dispense:  90 tablet    Refill:  4    Order Specific Question:   Supervising Provider    Answer:   Despina Hidden, LUTHER H [[id number]]  . LORazepam (ATIVAN) 0.5 MG tablet    Sig: Take 1 tablet (0.5 mg total) by mouth every 8 (eight) hours.    Dispense:  60 tablet    Refill:  0    Order Specific Question:   Supervising Provider    Answer:   Despina Hidden, LUTHER H [[id number]]   F/U in 3 months for pap and physical and ROS

## 2018-11-05 ENCOUNTER — Other Ambulatory Visit: Payer: Self-pay

## 2018-11-05 ENCOUNTER — Ambulatory Visit (INDEPENDENT_AMBULATORY_CARE_PROVIDER_SITE_OTHER): Payer: 59 | Admitting: Adult Health

## 2018-11-05 ENCOUNTER — Encounter: Payer: Self-pay | Admitting: Adult Health

## 2018-11-05 VITALS — BP 117/77 | HR 89 | Ht 67.0 in | Wt 163.0 lb

## 2018-11-05 DIAGNOSIS — N6323 Unspecified lump in the left breast, lower outer quadrant: Secondary | ICD-10-CM | POA: Diagnosis not present

## 2018-11-05 NOTE — Progress Notes (Signed)
Patient ID: Gina Morgan, female   DOB: 01/28/1981, 38 y.o.   MRN: [id number] History of Present Illness:  Gina Morgan is a 38 year old white female, worked in for mass found in left breast this weekend.  Current Medications, Allergies, Past Medical History, Past Surgical History, Family History and Social History were reviewed in [employer] record.     Review of Systems:  Left breast mass found this weekend   Physical Exam:BP 117/77 (BP Location: Right Arm, Patient Position: Sitting, Cuff Size: Normal)   Pulse 89   Ht 5\' 7"  (1.702 m)   Wt 163 lb (73.9 kg)   LMP 10/29/2018 (Exact Date)   BMI 25.53 kg/m  General:  Well developed, well nourished, no acute distress Skin:  Warm and dry Breast:  No dominant palpable mass, retraction, or nipple discharge on the right, on the left, no retraction or nipple discharge, but has 2 cm oval, mobile, non tender mass at 4 o'clock 2 FB from nipple  Psych:  No mood changes, alert and cooperative,seems happy Will get diagnostic mammogram and [place].    Impression: 1. Mass of lower outer quadrant of left breast       Plan: Diagnostic mammogram and [place] 11/13/2018 at 3 pm at [facility] F/U prn

## 2018-11-06 ENCOUNTER — Ambulatory Visit (HOSPITAL_COMMUNITY): Payer: 59

## 2018-11-06 ENCOUNTER — Encounter: Payer: Self-pay | Admitting: Adult Health

## 2018-11-06 ENCOUNTER — Ambulatory Visit (HOSPITAL_COMMUNITY)
Admission: RE | Admit: 2018-11-06 | Discharge: 2018-11-06 | Disposition: A | Discharge status: Home / Self Care | Payer: 59 | Source: Ambulatory Visit | Attending: Adult Health | Admitting: Adult Health

## 2018-11-06 DIAGNOSIS — N6002 Solitary cyst of left breast: Secondary | ICD-10-CM | POA: Insufficient documentation

## 2018-11-06 DIAGNOSIS — R922 Inconclusive mammogram: Secondary | ICD-10-CM | POA: Diagnosis not present

## 2018-11-06 DIAGNOSIS — N6323 Unspecified lump in the left breast, lower outer quadrant: Secondary | ICD-10-CM | POA: Diagnosis not present

## 2018-11-06 DIAGNOSIS — N6012 Diffuse cystic mastopathy of left breast: Secondary | ICD-10-CM | POA: Diagnosis not present

## 2018-11-13 ENCOUNTER — Other Ambulatory Visit (HOSPITAL_COMMUNITY): Payer: 59

## 2018-11-13 ENCOUNTER — Encounter (HOSPITAL_COMMUNITY): Payer: 59

## 2018-12-05 ENCOUNTER — Other Ambulatory Visit: Payer: Self-pay | Admitting: Adult Health

## 2018-12-05 MED ORDER — OXYBUTYNIN CHLORIDE ER 10 MG PO TB24: 10.0000 mg | ORAL_TABLET | Freq: Every day | ORAL | 0 refills | Status: DC

## 2018-12-05 NOTE — Progress Notes (Signed)
Will rx ditropan to see if helps with sweating at night

## 2018-12-24 ENCOUNTER — Other Ambulatory Visit: Payer: Self-pay | Admitting: Adult Health

## 2018-12-24 MED ORDER — OXYBUTYNIN CHLORIDE ER 10 MG PO TB24: 10.0000 mg | ORAL_TABLET | Freq: Every day | ORAL | 3 refills | Status: DC

## 2018-12-24 MED FILL — OXYBUTYNIN CL ER 10 MG TAB: 10 | 90 days supply | Qty: 90 | Fill #0

## 2018-12-24 NOTE — Progress Notes (Signed)
Refilled oxybutynin is working great, no sweating at night now

## 2019-01-22 MED FILL — SERTRALINE HCL 50 MG TABS: 50 | 90 days supply | Qty: 90 | Fill #1

## 2019-02-07 DIAGNOSIS — Z03818 Encounter for observation for suspected exposure to other biological agents ruled out: Secondary | ICD-10-CM | POA: Diagnosis not present

## 2019-03-07 IMAGING — MG DIGITAL DIAGNOSTIC BILATERAL MAMMOGRAM WITH TOMO AND CAD
6 of 10 series · 6 of 30 positions shown · non-contrast
Comparison: None.

CLINICAL DATA: 37-year-old female with a palpable area of concern
in the left breast.

EXAM:
DIGITAL DIAGNOSTIC BILATERAL MAMMOGRAM WITH CAD AND TOMO
LEFT BREAST ULTRASOUND

[L CC synth-2D (1 of 2)]
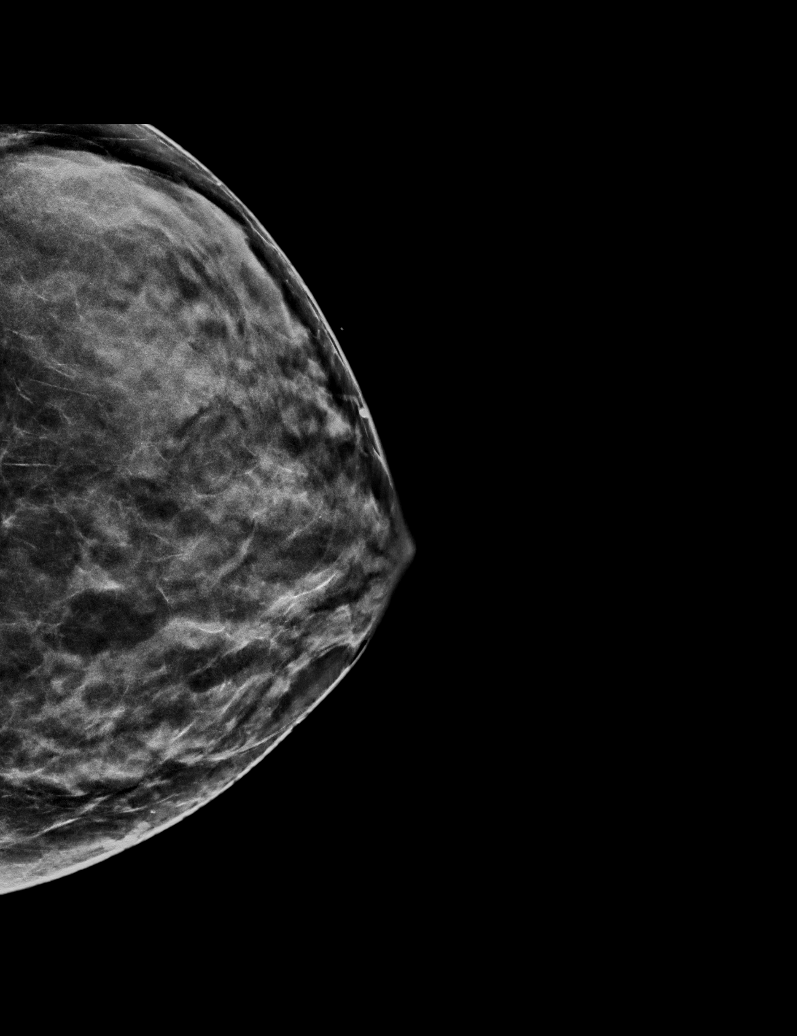

[R CC synth-2D]
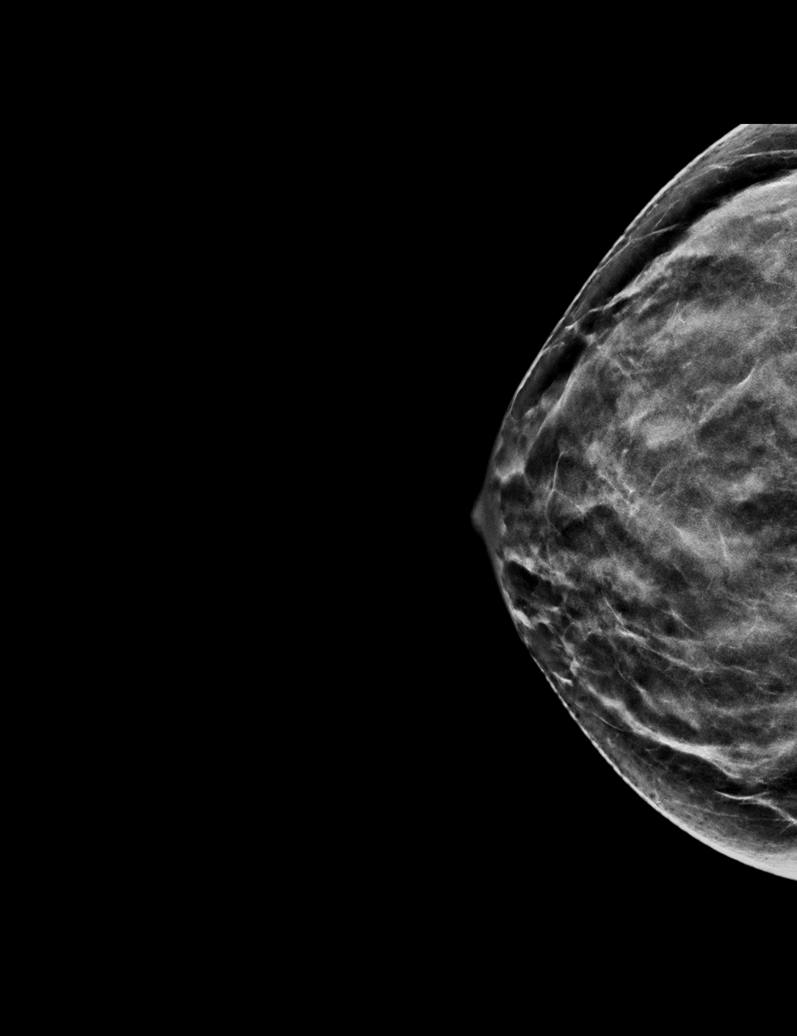

[L CC synth-2D (2 of 2)]
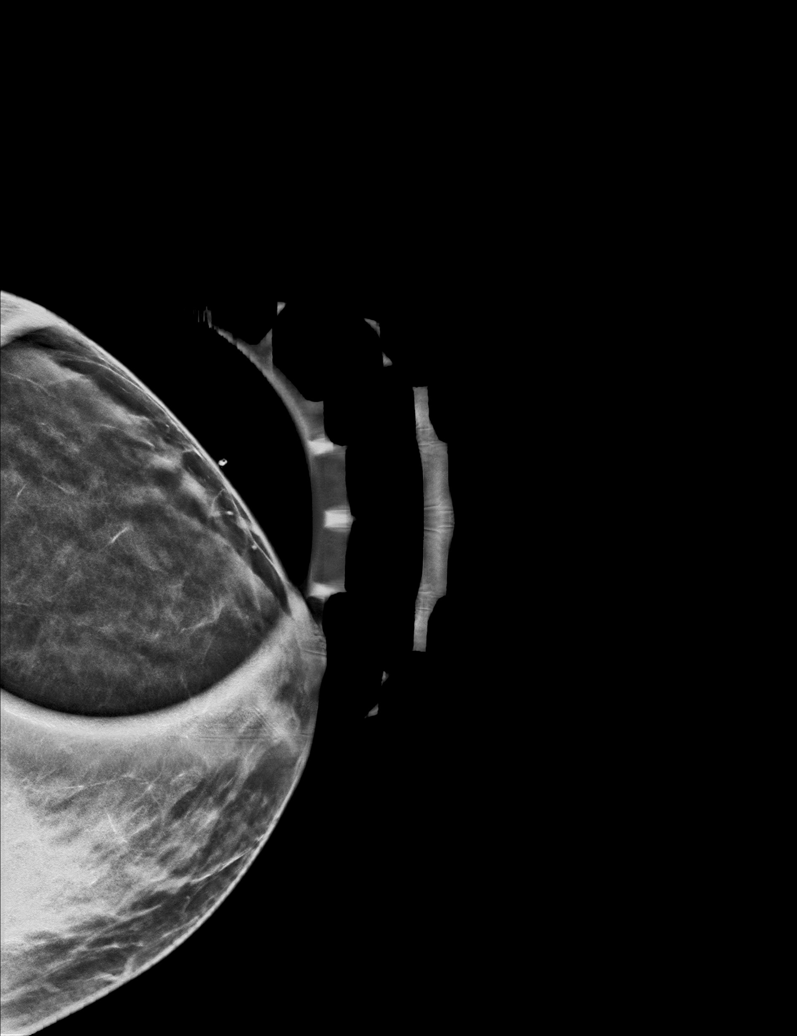

[L MLO synth-2D]
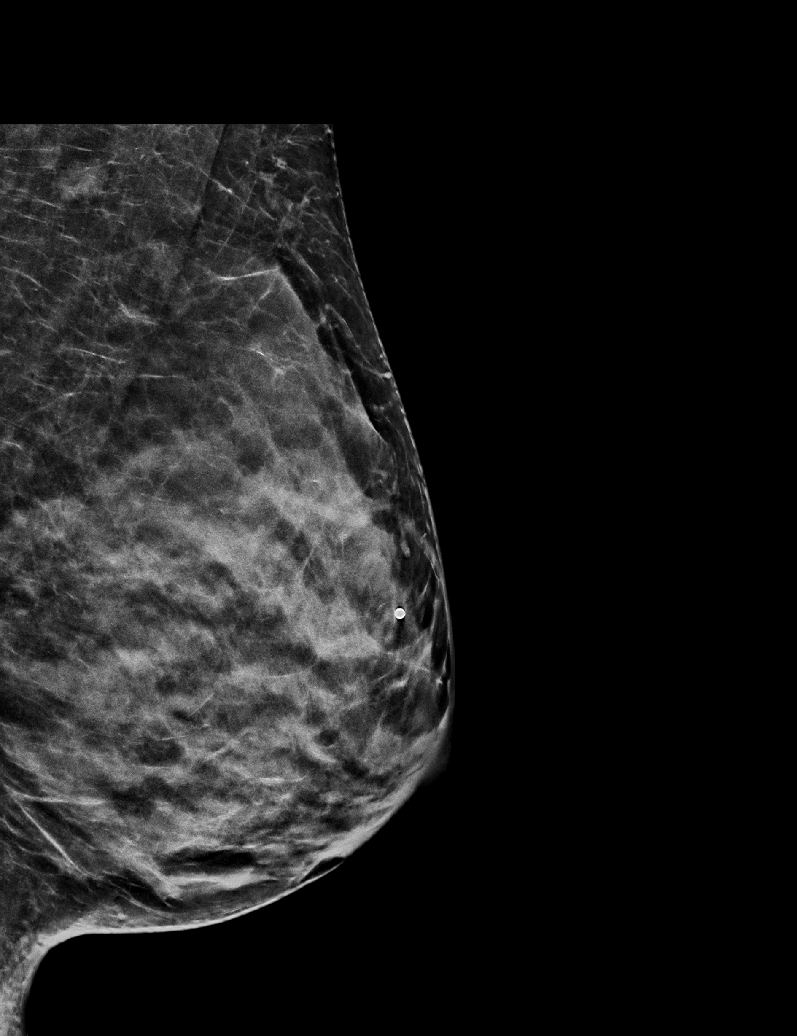

[R MLO synth-2D]
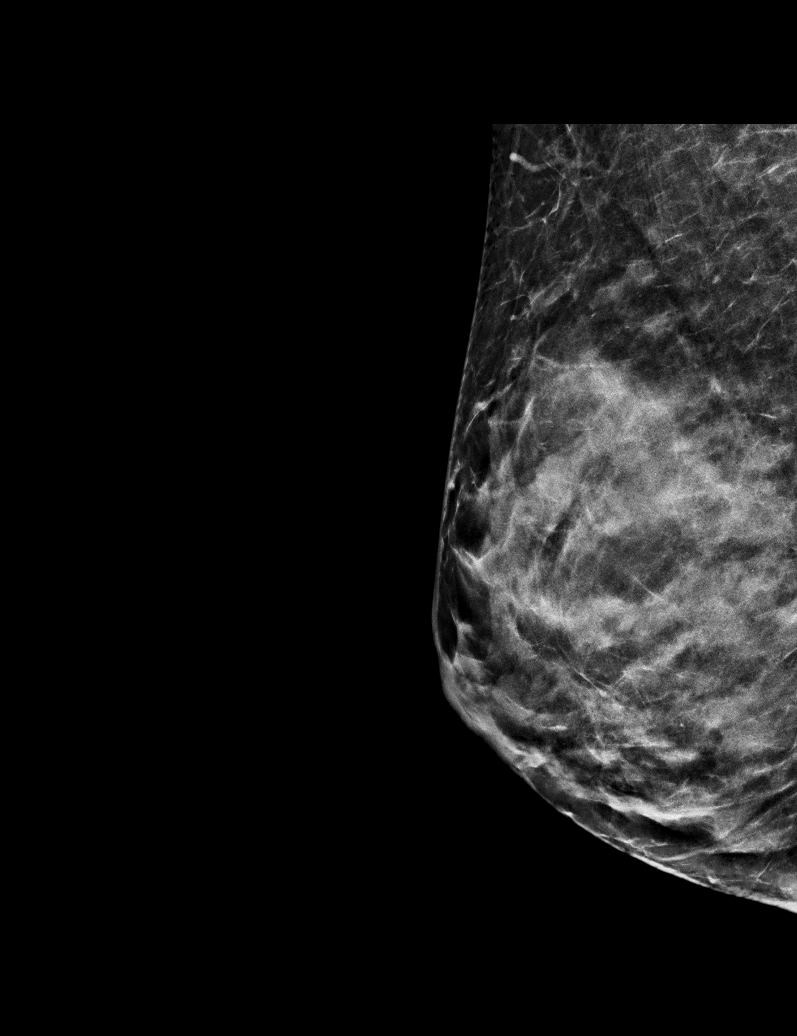

[R CC tomo · tomo slice 27/52.0]
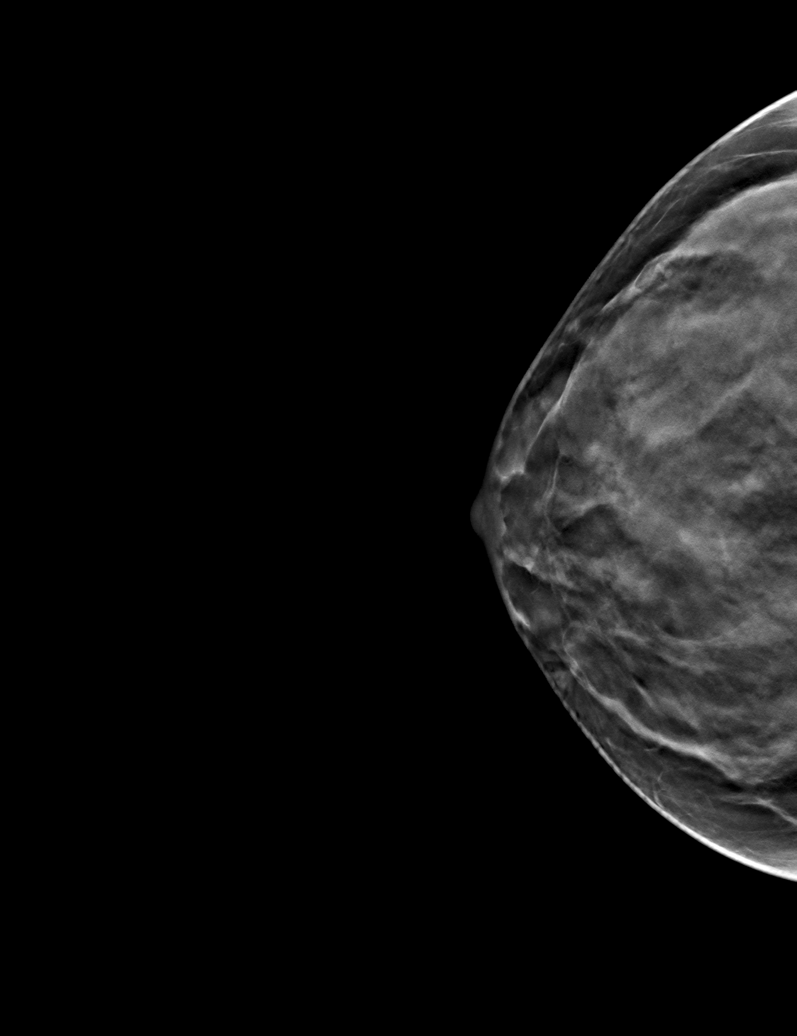

[6 of 30 positions shown; findings below may reference images not displayed]

ACR Breast Density Category d: The breast tissue is extremely dense,
which lowers the sensitivity of mammography.
FINDINGS: No suspicious masses or calcifications are seen in either.
Compression tangential tomograms were performed over the area of
concern in the left breast no definite seen, only extremely dense
fibroglandular tissue visualized.

Mammographic images were processed with CAD.

Physical examination at site of palpable concern reveals nodularity
at the 4-5 position.

Targeted ultrasound of the left breast was performed. There is a
cyst in the region of palpable concern at the 4 o'clock position 5
cm from nipple measuring 1.7 x 0.9 x 1.8 cm. The entire central left
breast was scanned with only a few scattered cysts and extremely
dense fibroglandular tissue identified. No suspicious abnormality
seen.
IMPRESSION: Left breast cyst.  No findings of malignancy in either breast.

RECOMMENDATION:
Screening mammogram at age 41 unless there are persistent or
intervening clinical concerns. (Code:0T-T-ZBT)

I have discussed the findings and recommendations with the patient.
Results were also provided in writing at the conclusion of the
visit. If applicable, a reminder letter will be sent to the patient
regarding the next appointment.

BI-RADS CATEGORY  2: Benign.

## 2019-03-12 ENCOUNTER — Other Ambulatory Visit: Payer: Self-pay | Admitting: Adult Health

## 2019-03-12 MED ORDER — LORAZEPAM 0.5 MG PO TABS: 0.5000 mg | ORAL_TABLET | Freq: Three times a day (TID) | ORAL | 0 refills | Status: DC

## 2019-03-12 MED ORDER — SERTRALINE HCL 100 MG PO TABS: 100.0000 mg | ORAL_TABLET | Freq: Every day | ORAL | 4 refills | Status: DC

## 2019-03-12 MED FILL — LORazepam 0.5 MG TABS: 0.5 | 20 days supply | Qty: 60 | Fill #0

## 2019-03-12 MED FILL — SERTRALINE HCL 100 MG TABS: 100 | 90 days supply | Qty: 90 | Fill #0

## 2019-03-12 NOTE — Progress Notes (Signed)
Refilled zoloft at 100 mg and ativan

## 2019-04-02 MED FILL — OXYBUTYNIN CL ER 10 MG TAB: 10 | 90 days supply | Qty: 90 | Fill #1

## 2019-04-09 ENCOUNTER — Telehealth: Payer: 59 | Admitting: Family

## 2019-04-09 DIAGNOSIS — H1010 Acute atopic conjunctivitis, unspecified eye: Secondary | ICD-10-CM

## 2019-04-09 NOTE — Progress Notes (Signed)
We are sorry that you are not feeling well.  Here is how we plan to help!  Based on what you have shared with me it looks like you have conjunctivitis.  Conjunctivitis is a common inflammatory or infectious condition of the eye that is often referred to as "pink eye".  In most cases it is contagious (viral or bacterial). However, not all conjunctivitis requires antibiotics (ex. Allergic).  We have made appropriate suggestions for you based upon your presentation.  I recommend that you use OpconA, 1-2 drops every 4-6 hours (an over the counter allergy drop available at your local pharmacy).  Your pharmacist may have an alternative suggestion.  Pink eye can be highly contagious.  It is typically spread through direct contact with secretions, or contaminated objects or surfaces that one may have touched.  Strict handwashing is suggested with soap and water is urged.  If not available, use alcohol based had sanitizer.  Avoid unnecessary touching of the eye.  If you wear contact lenses, you will need to refrain from wearing them until you see no white discharge from the eye for at least 24 hours after being on medication.  You should see symptom improvement in 1-2 days after starting the medication regimen.  Call us if symptoms are not improved in 1-2 days.  Home Care:  Wash your hands often!  Do not wear your contacts until you complete your treatment plan.  Avoid sharing towels, bed linen, personal items with a person who has pink eye.  See attention for anyone in your home with similar symptoms.  Get Help Right Away If:  Your symptoms do not improve.  You develop blurred or loss of vision.  Your symptoms worsen (increased discharge, pain or redness)  Your e-visit answers were reviewed by a board certified advanced clinical practitioner to complete your personal care plan.  Depending on the condition, your plan could have included both over the counter or prescription medications.  If there  is a problem please reply  once you have received a response from your provider.  Your safety is important to us.  If you have drug allergies check your prescription carefully.    You can use MyChart to ask questions about today's visit, request a non-urgent call back, or ask for a work or school excuse for 24 hours related to this e-Visit. If it has been greater than 24 hours you will need to follow up with your provider, or enter a new e-Visit to address those concerns.   You will get an e-mail in the next two days asking about your experience.  I hope that your e-visit has been valuable and will speed your recovery. Thank you for using e-visits.    Greater than 5 minutes, yet less than 10 minutes of time have been spent researching, coordinating, and implementing care for this patient today.  Thank you for the details you included in the comment boxes. Those details are very helpful in determining the best course of treatment for you and help us to provide the best care.  

## 2019-05-02 ENCOUNTER — Telehealth: Payer: 59 | Admitting: Physician Assistant

## 2019-05-02 DIAGNOSIS — J019 Acute sinusitis, unspecified: Secondary | ICD-10-CM

## 2019-05-02 DIAGNOSIS — B9689 Other specified bacterial agents as the cause of diseases classified elsewhere: Secondary | ICD-10-CM

## 2019-05-02 MED ORDER — AMOXICILLIN-POT CLAVULANATE 875-125 MG PO TABS: 1.0000 | ORAL_TABLET | Freq: Two times a day (BID) | ORAL | 0 refills | Status: DC

## 2019-05-02 NOTE — Progress Notes (Signed)

## 2019-05-02 NOTE — Progress Notes (Signed)
I have spent 5 minutes in review of e-visit questionnaire, review and updating patient chart, medical decision making and response to patient.   Axl Rodino Cody Chiquitta Matty, PA-C    

## 2019-05-17 ENCOUNTER — Other Ambulatory Visit: Payer: Self-pay | Admitting: *Deleted

## 2019-05-17 DIAGNOSIS — Z20822 Contact with and (suspected) exposure to covid-19: Secondary | ICD-10-CM

## 2019-05-17 DIAGNOSIS — R6889 Other general symptoms and signs: Secondary | ICD-10-CM | POA: Diagnosis not present

## 2019-05-19 LAB — NOVEL CORONAVIRUS, NAA: SARS-CoV-2, NAA: NOT DETECTED

## 2019-06-13 ENCOUNTER — Telehealth: Payer: Self-pay | Admitting: Adult Health

## 2019-06-13 ENCOUNTER — Telehealth: Payer: 59 | Admitting: Physician Assistant

## 2019-06-13 DIAGNOSIS — M549 Dorsalgia, unspecified: Secondary | ICD-10-CM

## 2019-06-13 MED ORDER — TAYTULLA 1-20 MG-MCG(24) PO CAPS: 1.0000 | ORAL_CAPSULE | Freq: Every day | ORAL | 0 refills | Status: DC

## 2019-06-13 MED ORDER — PROMETHAZINE HCL 25 MG PO TABS: 25.0000 mg | ORAL_TABLET | Freq: Four times a day (QID) | ORAL | 1 refills | Status: DC | PRN

## 2019-06-13 MED ORDER — CYCLOBENZAPRINE HCL 10 MG PO TABS: 10.0000 mg | ORAL_TABLET | Freq: Three times a day (TID) | ORAL | 0 refills | Status: DC | PRN

## 2019-06-13 MED ORDER — NAPROXEN 500 MG PO TABS: 500.0000 mg | ORAL_TABLET | Freq: Two times a day (BID) | ORAL | 0 refills | Status: DC

## 2019-06-13 NOTE — Telephone Encounter (Signed)
Gina Morgan started her period yesterday, and it is heavy and has cramps and headache with some nausea, she is not sexually active at the moment and is not on any birth control, will start Taytulla, 4 packs given to start today and will rx phenergan po, rest and push fluids.

## 2019-06-13 NOTE — Progress Notes (Signed)
I have spent 5 minutes in review of e-visit questionnaire, review and updating patient chart, medical decision making and response to patient.   Moksh Loomer Cody Mertice Uffelman, PA-C    

## 2019-06-13 NOTE — Progress Notes (Signed)

## 2019-06-28 MED FILL — SERTRALINE HCL 100 MG TAB: 100 | 90 days supply | Qty: 90 | Fill #1

## 2019-06-28 MED FILL — OXYBUTYNIN CL ER 10 MG TAB: 10 | 90 days supply | Qty: 90 | Fill #2

## 2019-07-16 ENCOUNTER — Other Ambulatory Visit: Payer: Self-pay

## 2019-07-16 MED ORDER — LORAZEPAM 0.5 MG PO TABS: 0.5000 mg | ORAL_TABLET | Freq: Three times a day (TID) | ORAL | 0 refills | Status: DC

## 2019-07-16 MED FILL — LORazepam 0.5 MG TABS: 0.5 | 20 days supply | Qty: 60 | Fill #0

## 2019-09-10 ENCOUNTER — Telehealth: Payer: Self-pay | Admitting: Adult Health

## 2019-09-10 MED ORDER — CEPHALEXIN 500 MG PO CAPS: 500.0000 mg | ORAL_CAPSULE | Freq: Four times a day (QID) | ORAL | 0 refills | Status: DC

## 2019-09-10 NOTE — Telephone Encounter (Signed)
Pt has had negative COVID test. Complains of  Sinus pain and pressure and sore throat, will rx keflex.

## 2019-09-23 MED FILL — SERTRALINE HCL 100 MG TAB: 100 | 90 days supply | Qty: 90 | Fill #2

## 2019-09-23 MED FILL — OXYBUTYNIN CL ER 10 MG TAB: 10 | 90 days supply | Qty: 90 | Fill #3

## 2019-10-29 ENCOUNTER — Other Ambulatory Visit: Payer: Self-pay | Admitting: Adult Health

## 2019-10-29 ENCOUNTER — Other Ambulatory Visit: Payer: Self-pay

## 2019-10-29 MED ORDER — LORAZEPAM 0.5 MG PO TABS: 0.5000 mg | ORAL_TABLET | Freq: Three times a day (TID) | ORAL | 0 refills | Status: DC

## 2019-10-29 MED FILL — LORazepam 0.5 MG TABS: 0.5 | 20 days supply | Qty: 60 | Fill #0

## 2019-10-29 NOTE — Progress Notes (Signed)
Refilled ativan

## 2019-11-12 DIAGNOSIS — L218 Other seborrheic dermatitis: Secondary | ICD-10-CM | POA: Diagnosis not present

## 2019-12-12 ENCOUNTER — Telehealth: Payer: Self-pay | Admitting: Adult Health

## 2019-12-12 MED ORDER — NORETHIN ACE-ETH ESTRAD-FE 1-20 MG-MCG PO TABS: 1.0000 | ORAL_TABLET | Freq: Every day | ORAL | 4 refills | Status: DC

## 2019-12-12 MED FILL — BLISOVI FE 1/20 1-20 MG-MCG: 1-20 | 84 days supply | Qty: 84 | Fill #0

## 2019-12-12 NOTE — Telephone Encounter (Signed)
Has been doing great with Taytulla but we have no samples and it is not covered by insurance, will rx junel 1/20

## 2020-02-27 MED FILL — BLISOVI FE 1/20 1-20 MG-MCG: 1-20 | 84 days supply | Qty: 84 | Fill #1

## 2020-02-27 MED FILL — SERTRALINE HCL 100 MG TAB: 100 | 90 days supply | Qty: 90 | Fill #3

## 2020-03-11 ENCOUNTER — Ambulatory Visit (INDEPENDENT_AMBULATORY_CARE_PROVIDER_SITE_OTHER): Payer: 59 | Admitting: Adult Health

## 2020-03-11 ENCOUNTER — Other Ambulatory Visit: Payer: Self-pay

## 2020-03-11 ENCOUNTER — Encounter: Payer: Self-pay | Admitting: Adult Health

## 2020-03-11 ENCOUNTER — Other Ambulatory Visit (HOSPITAL_COMMUNITY)
Admission: RE | Admit: 2020-03-11 | Discharge: 2020-03-11 | Disposition: A | Discharge status: Home / Self Care | Payer: 59 | Source: Ambulatory Visit | Attending: Adult Health | Admitting: Adult Health

## 2020-03-11 VITALS — BP 111/62 | HR 102 | Ht 67.0 in

## 2020-03-11 DIAGNOSIS — Z793 Long term (current) use of hormonal contraceptives: Secondary | ICD-10-CM

## 2020-03-11 DIAGNOSIS — Z3041 Encounter for surveillance of contraceptive pills: Secondary | ICD-10-CM | POA: Insufficient documentation

## 2020-03-11 DIAGNOSIS — Z01419 Encounter for gynecological examination (general) (routine) without abnormal findings: Secondary | ICD-10-CM | POA: Insufficient documentation

## 2020-03-11 NOTE — Progress Notes (Signed)
Patient ID: Gina Morgan, female   DOB: 23-Sep-1980, 39 y.o.   MRN: [id number] History of Present Illness: Adilenne is a 39 year old white female, divorced, G1P1, in for a well woman gyn exam and pap. She is happy with OCs.   Current Medications, Allergies, Past Medical History, Past Surgical History, Family History and Social History were reviewed in [employer] record.     Review of Systems:  Patient denies any headaches, hearing loss, fatigue, blurred vision, shortness of breath, chest pain, abdominal pain, problems with bowel movements, urination, or intercourse(not active). No joint pain or mood swings.   Physical Exam:BP 111/62 (BP Location: Right Arm, Patient Position: Sitting, Cuff Size: Normal)   Pulse (!) 102   Ht 5\' 7"  (1.702 m)   LMP 02/20/2020   BMI 25.53 kg/m  General:  Well developed, well nourished, no acute distress Skin:  Warm and dry Neck:  Midline trachea, normal thyroid, good ROM, no lymphadenopathy Lungs; Clear to auscultation bilaterally Breast:  No dominant palpable mass, retraction, or nipple discharge,has dense tissue and regular irregularities. Cardiovascular: Regular rate and rhythm Abdomen:  Soft, non tender, no hepatosplenomegaly Pelvic:  External genitalia is normal in appearance, no lesions.  The vagina is normal in appearance. Urethra has no lesions or masses. The cervix is bulbous. Pap with high risk HPV 16/18 genotyping performed. Uterus is felt to be normal size, shape, and contour.  No adnexal masses or tenderness noted.Bladder is non tender, no masses felt. Extremities/musculoskeletal:  No swelling or varicosities noted, no clubbing or cyanosis Psych:  No mood changes, alert and cooperative,seems happy AA 2 Fall risk is low PHQ 9 score is 0 Pt gave verbal consent for exam with out chaperone.  Impression and Plan: 1. Encounter for gynecological examination with Papanicolaou smear of cervix Pap sent Physical in 1  year Pap in 3 if normal Mammogram at 41 - fasting labs in furture  2. Encounter for surveillance of contraceptive pills Continue OCs, Loestrin 1/20 has refills

## 2020-03-13 LAB — CYTOLOGY - PAP
Comment: NEGATIVE
Diagnosis: NEGATIVE
High risk HPV: NEGATIVE

## 2020-04-14 ENCOUNTER — Other Ambulatory Visit: Payer: Self-pay | Admitting: Adult Health

## 2020-04-14 ENCOUNTER — Telehealth: Payer: Self-pay | Admitting: Adult Health

## 2020-04-14 MED ORDER — ETONOGESTREL-ETHINYL ESTRADIOL 0.12-0.015 MG/24HR VA RING: 1.0000 | VAGINAL_RING | VAGINAL | 4 refills | Status: DC

## 2020-04-14 MED FILL — ETONOGESTREL-ETHINYL ESTRAD: 0.12-0.015 | 84 days supply | Qty: 3 | Fill #0

## 2020-04-14 NOTE — Telephone Encounter (Signed)
forgets OCs will rx nuva ring

## 2020-06-10 ENCOUNTER — Telehealth: Payer: 59 | Admitting: Family

## 2020-06-10 DIAGNOSIS — M546 Pain in thoracic spine: Secondary | ICD-10-CM | POA: Diagnosis not present

## 2020-06-10 MED ORDER — BACLOFEN 10 MG PO TABS: 10.0000 mg | ORAL_TABLET | Freq: Three times a day (TID) | ORAL | 0 refills | Status: DC

## 2020-06-10 MED ORDER — NAPROXEN 500 MG PO TABS: 500.0000 mg | ORAL_TABLET | Freq: Two times a day (BID) | ORAL | 0 refills | Status: DC

## 2020-06-10 MED FILL — BACLOFEN 10 MG TABS: 10 | 10 days supply | Qty: 30 | Fill #0

## 2020-06-10 MED FILL — NAPROXEN 500 MG TABS: 500 | 15 days supply | Qty: 30 | Fill #0

## 2020-06-10 NOTE — Progress Notes (Signed)

## 2020-06-30 MED FILL — ETONOGESTREL-ETHINYL ESTRAD: 0.12-0.015 | 84 days supply | Qty: 3 | Fill #1

## 2020-08-19 ENCOUNTER — Other Ambulatory Visit (INDEPENDENT_AMBULATORY_CARE_PROVIDER_SITE_OTHER): Payer: 59

## 2020-08-19 ENCOUNTER — Other Ambulatory Visit: Payer: Self-pay

## 2020-08-19 ENCOUNTER — Other Ambulatory Visit: Payer: Self-pay | Admitting: Women's Health

## 2020-08-19 DIAGNOSIS — R300 Dysuria: Secondary | ICD-10-CM | POA: Diagnosis not present

## 2020-08-19 MED ORDER — NITROFURANTOIN MONOHYD MACRO 100 MG PO CAPS: 100.0000 mg | ORAL_CAPSULE | Freq: Two times a day (BID) | ORAL | 0 refills | Status: DC

## 2020-08-19 MED FILL — NITROFURANTOIN MONO-MCR 100: 100 | 7 days supply | Qty: 14 | Fill #0

## 2020-08-19 NOTE — Progress Notes (Addendum)
   NURSE VISIT- UTI SYMPTOMS   SUBJECTIVE:  Gina Morgan is a 39 y.o. G89P1001 female here for UTI symptoms. She is a GYN patient. She reports burning with urination x 2 days.  OBJECTIVE:  There were no vitals taken for this visit.  Appears well, in no apparent distress  No results found for this or any previous visit (from the past 24 hour(s)).  ASSESSMENT: GYN patient with UTI symptoms and negative nitrites  PLAN: Note routed to Joellyn Haff, CNM, [facility]   Rx sent by provider today: No Urine culture sent Call or return to clinic prn if these symptoms worsen or fail to improve as anticipated. Follow-up: as needed   Malachy Mood  08/19/2020 11:40 AM   Chart reviewed for nurse visit. Agree with plan of care. Rx macrobid for sx, cx pending. Cheral Marker, [place] 08/19/2020 12:57 PM

## 2020-08-19 NOTE — Addendum Note (Signed)
Addended by: Cheral Marker on: 08/19/2020 12:57 PM   Modules accepted: Orders

## 2020-08-22 LAB — URINE CULTURE: Organism ID, Bacteria: NO GROWTH

## 2020-09-16 MED FILL — ETONOGESTREL-ETHINYL ESTRAD: 0.12-0.015 | 84 days supply | Qty: 3 | Fill #2

## 2020-11-06 ENCOUNTER — Telehealth: Payer: 59 | Admitting: Physician Assistant

## 2020-11-06 ENCOUNTER — Other Ambulatory Visit: Payer: Self-pay | Admitting: Physician Assistant

## 2020-11-06 DIAGNOSIS — J309 Allergic rhinitis, unspecified: Secondary | ICD-10-CM

## 2020-11-06 MED ORDER — FLUTICASONE PROPIONATE 50 MCG/ACT NA SUSP: 2.0000 | Freq: Every day | NASAL | 0 refills | Status: DC

## 2020-11-06 MED FILL — FLUTICASONE PROP 50 MCG SPR: 50 | 30 days supply | Qty: 16 | Fill #0

## 2020-11-06 NOTE — Progress Notes (Signed)
E visit for Allergic Rhinitis We are sorry that you are not feeling well.  Here is how we plan to help!  Based on what you have shared with me it looks like you have Allergic Rhinitis.  Rhinitis is when a reaction occurs that causes nasal congestion, runny nose, sneezing, and itching.  Most types of rhinitis are caused by an inflammation and are associated with symptoms in the eyes ears or throat. There are several types of rhinitis.  The most common are acute rhinitis, which is usually caused by a viral illness, allergic or seasonal rhinitis, and nonallergic or year-round rhinitis.  Nasal allergies occur certain times of the year.  Allergic rhinitis is caused when allergens in the air trigger the release of histamine in the body.  Histamine causes itching, swelling, and fluid to build up in the fragile linings of the nasal passages, sinuses and eyelids.  An itchy nose and clear discharge are common.  I recommend the following over the counter treatments: You should take a daily dose of antihistamine and Xyzal 5 mg take 1 tablet daily  I also would recommend a nasal spray: Flonase 2 sprays into each nostril once daily and Saline 1 spray into each nostril as needed  You may also benefit from eye drops such as: Visine 1-2 drops each eye twice daily as needed  HOME CARE:   You can use an over-the-counter saline nasal spray as needed  Avoid areas where there is heavy dust, mites, or molds  Stay indoors on windy days during the pollen season  Keep windows closed in home, at least in bedroom; use air conditioner.  Use high-efficiency house air filter  Keep windows closed in car, turn AC on re-circulate  Avoid playing out with dog during pollen season  GET HELP RIGHT AWAY IF:   If your symptoms do not improve within 10 days  You become short of breath  You develop yellow or green discharge from your nose for over 3 days  You have coughing fits  MAKE SURE YOU:   Understand these  instructions  Will watch your condition  Will get help right away if you are not doing well or get worse  Thank you for choosing an e-visit. Your e-visit answers were reviewed by a board certified advanced clinical practitioner to complete your personal care plan. Depending upon the condition, your plan could have included both over the counter or prescription medications. Please review your pharmacy choice. Be sure that the pharmacy you have chosen is open so that you can pick up your prescription now.  If there is a problem you may message your provider in MyChart to have the prescription routed to another pharmacy. Your safety is important to [place]. If you have drug allergies check your prescription carefully.  For the next 24 hours, you can use MyChart to ask questions about today's visit, request a non-urgent call back, or ask for a work or school excuse from your e-visit provider. You will get an email in the next two days asking about your experience. I hope that your e-visit has been valuable and will speed your recovery.      Greater than 5 minutes, yet less than 10 minutes of time have been spent researching, coordinating and implementing care for this patient today.

## 2020-12-03 MED FILL — ETONOGESTREL-ETHINYL ESTRAD: 0.12-0.015 | 84 days supply | Qty: 3 | Fill #3

## 2021-01-26 ENCOUNTER — Other Ambulatory Visit (HOSPITAL_COMMUNITY): Payer: Self-pay

## 2021-02-01 ENCOUNTER — Other Ambulatory Visit (HOSPITAL_COMMUNITY): Payer: Self-pay

## 2021-02-03 ENCOUNTER — Telehealth: Payer: Self-pay | Admitting: Adult Health

## 2021-02-03 MED ORDER — CEPHALEXIN 500 MG PO CAPS: 500.0000 mg | ORAL_CAPSULE | Freq: Four times a day (QID) | ORAL | 0 refills | Status: DC

## 2021-02-03 NOTE — Telephone Encounter (Signed)
Gina Morgan has pain over cheek bones, cough and yellow nasal mucous, will rx keflex,has used mucinsex. No fever

## 2021-03-11 ENCOUNTER — Other Ambulatory Visit: Payer: 59 | Admitting: Adult Health

## 2021-03-16 ENCOUNTER — Other Ambulatory Visit: Payer: 59 | Admitting: Adult Health

## 2021-03-18 ENCOUNTER — Other Ambulatory Visit (HOSPITAL_COMMUNITY): Payer: Self-pay

## 2021-03-18 MED FILL — Etonogestrel-Ethinyl Estradiol VA Ring 0.12-0.015 MG/24HR: VAGINAL | 84 days supply | Qty: 3 | Fill #0 | Status: CN

## 2021-03-26 ENCOUNTER — Other Ambulatory Visit (HOSPITAL_COMMUNITY): Payer: Self-pay

## 2021-05-11 ENCOUNTER — Other Ambulatory Visit (HOSPITAL_COMMUNITY): Payer: Self-pay

## 2021-05-11 ENCOUNTER — Other Ambulatory Visit: Payer: Self-pay | Admitting: Adult Health

## 2021-05-11 MED ORDER — ETONOGESTREL-ETHINYL ESTRADIOL 0.12-0.015 MG/24HR VA RING
VAGINAL_RING | VAGINAL | 4 refills | Status: DC
  Filled 2021-05-11: qty 3, 84d supply, fill #0
  Filled 2021-10-23: qty 3, 84d supply, fill #1
  Filled 2022-02-04: qty 3, 84d supply, fill #2
  Filled 2022-04-14: qty 3, 84d supply, fill #3

## 2021-06-28 ENCOUNTER — Other Ambulatory Visit (HOSPITAL_COMMUNITY): Payer: Self-pay

## 2021-07-08 ENCOUNTER — Other Ambulatory Visit: Payer: Self-pay | Admitting: Adult Health

## 2021-07-08 MED ORDER — DOXYCYCLINE HYCLATE 100 MG PO TABS: 100.0000 mg | ORAL_TABLET | Freq: Two times a day (BID) | ORAL | 0 refills | Status: DC

## 2021-07-08 NOTE — Progress Notes (Signed)
Has dog leash burn with scab and 4 and 5 th fingers left hand with some redness and tenderness on 5th finger will rx doxycycline

## 2021-07-22 ENCOUNTER — Telehealth: Payer: 59 | Admitting: Nurse Practitioner

## 2021-07-22 DIAGNOSIS — B9689 Other specified bacterial agents as the cause of diseases classified elsewhere: Secondary | ICD-10-CM | POA: Diagnosis not present

## 2021-07-22 DIAGNOSIS — J329 Chronic sinusitis, unspecified: Secondary | ICD-10-CM

## 2021-07-22 MED ORDER — AMOXICILLIN-POT CLAVULANATE 875-125 MG PO TABS: 1.0000 | ORAL_TABLET | Freq: Two times a day (BID) | ORAL | 0 refills | Status: AC

## 2021-07-22 NOTE — Progress Notes (Signed)
I have spent 5 minutes in review of e-visit questionnaire, review and updating patient chart, medical decision making and response to patient.  ° °Gina Morgan W Hanifa Antonetti, NP ° °  °

## 2021-07-22 NOTE — Progress Notes (Signed)

## 2021-07-28 ENCOUNTER — Other Ambulatory Visit (HOSPITAL_COMMUNITY): Payer: Self-pay

## 2021-08-31 ENCOUNTER — Other Ambulatory Visit (HOSPITAL_COMMUNITY): Payer: Self-pay

## 2021-09-17 ENCOUNTER — Ambulatory Visit: Payer: 59 | Admitting: Nurse Practitioner

## 2021-09-24 ENCOUNTER — Other Ambulatory Visit (HOSPITAL_COMMUNITY): Payer: Self-pay

## 2021-10-27 ENCOUNTER — Other Ambulatory Visit (HOSPITAL_COMMUNITY): Payer: Self-pay

## 2021-12-13 ENCOUNTER — Ambulatory Visit: Payer: 59 | Admitting: Internal Medicine

## 2022-02-04 ENCOUNTER — Other Ambulatory Visit (HOSPITAL_COMMUNITY): Payer: Self-pay

## 2022-02-04 ENCOUNTER — Telehealth: Payer: Self-pay | Admitting: Emergency Medicine

## 2022-02-04 DIAGNOSIS — N3000 Acute cystitis without hematuria: Secondary | ICD-10-CM

## 2022-02-04 MED ORDER — CEPHALEXIN 500 MG PO CAPS: 500.0000 mg | ORAL_CAPSULE | Freq: Two times a day (BID) | ORAL | 0 refills | Status: AC

## 2022-02-04 NOTE — Progress Notes (Signed)
E-Visit for Urinary Problems ? ?We are sorry that you are not feeling well.  Here is how we plan to help! ? ?Based on what you shared with me it looks like you most likely have a simple urinary tract infection. ? ?A UTI (Urinary Tract Infection) is a bacterial infection of the bladder. ? ?Most cases of urinary tract infections are simple to treat but a key part of your care is to encourage you to drink plenty of fluids and watch your symptoms carefully. ? ?I have prescribed Keflex 500 mg twice a day for 7 days.  Your symptoms should gradually improve. Call us if the burning in your urine worsens, you develop worsening fever, back pain or pelvic pain or if your symptoms do not resolve after completing the antibiotic. ? ?Urinary tract infections can be prevented by drinking plenty of water to keep your body hydrated.  Also be sure when you wipe, wipe from front to back and don't hold it in!  If possible, empty your bladder every 4 hours. ? ?HOME CARE ?Drink plenty of fluids ?Compete the full course of the antibiotics even if the symptoms resolve ?Remember, when you need to go?go. Holding in your urine can increase the likelihood of getting a UTI! ?GET HELP RIGHT AWAY IF: ?You cannot urinate ?You get a high fever ?Worsening back pain occurs ?You see blood in your urine ?You feel sick to your stomach or throw up ?You feel like you are going to pass out ? ?MAKE SURE YOU  ?Understand these instructions. ?Will watch your condition. ?Will get help right away if you are not doing well or get worse. ? ? ?Thank you for choosing an e-visit. ? ?Your e-visit answers were reviewed by a board certified advanced clinical practitioner to complete your personal care plan. Depending upon the condition, your plan could have included both over the counter or prescription medications. ? ?Please review your pharmacy choice. Make sure the pharmacy is open so you can pick up prescription now. If there is a problem, you may contact your  provider through MyChart messaging and have the prescription routed to another pharmacy.  Your safety is important to us. If you have drug allergies check your prescription carefully.  ? ?For the next 24 hours you can use MyChart to ask questions about today's visit, request a non-urgent call back, or ask for a work or school excuse. ?You will get an email in the next two days asking about your experience. I hope that your e-visit has been valuable and will speed your recovery. ? ?I have spent 5 minutes in review of e-visit questionnaire, review and updating patient chart, medical decision making and response to patient.  ? ?Nickolaus Bordelon, PhD, FNP-BC ?  ?

## 2022-03-11 ENCOUNTER — Other Ambulatory Visit: Payer: Self-pay | Admitting: Adult Health

## 2022-04-12 ENCOUNTER — Other Ambulatory Visit (HOSPITAL_COMMUNITY): Payer: Self-pay

## 2022-04-14 ENCOUNTER — Other Ambulatory Visit (HOSPITAL_COMMUNITY): Payer: Self-pay

## 2022-04-22 ENCOUNTER — Other Ambulatory Visit (HOSPITAL_COMMUNITY)
Admission: RE | Admit: 2022-04-22 | Discharge: 2022-04-22 | Disposition: A | Discharge status: Home / Self Care | Payer: No Typology Code available for payment source | Source: Ambulatory Visit | Attending: Adult Health | Admitting: Adult Health

## 2022-04-22 ENCOUNTER — Other Ambulatory Visit (HOSPITAL_COMMUNITY): Payer: Self-pay

## 2022-04-22 ENCOUNTER — Encounter: Payer: Self-pay | Admitting: Adult Health

## 2022-04-22 ENCOUNTER — Ambulatory Visit (INDEPENDENT_AMBULATORY_CARE_PROVIDER_SITE_OTHER): Payer: No Typology Code available for payment source | Admitting: Adult Health

## 2022-04-22 VITALS — BP 108/63 | HR 82 | Ht 67.0 in | Wt 152.0 lb

## 2022-04-22 DIAGNOSIS — Z01419 Encounter for gynecological examination (general) (routine) without abnormal findings: Secondary | ICD-10-CM

## 2022-04-22 DIAGNOSIS — Z3044 Encounter for surveillance of vaginal ring hormonal contraceptive device: Secondary | ICD-10-CM | POA: Insufficient documentation

## 2022-04-22 MED ORDER — ETONOGESTREL-ETHINYL ESTRADIOL 0.12-0.015 MG/24HR VA RING
VAGINAL_RING | VAGINAL | 4 refills | Status: DC
  Filled 2022-04-22 – 2023-03-23 (×2): qty 3, 84d supply, fill #0

## 2022-04-22 NOTE — Progress Notes (Signed)
Patient ID: Gina Morgan, female   DOB: 12-Dec-1980, 41 y.o.   MRN: [id number] History of Present Illness: Gina Morgan is a 41 year old white female,divorced, G1P1 in for a well woman gyn exam and pap. She has lost some weight is eating better, and walking more.   Current Medications, Allergies, Past Medical History, Past Surgical History, Family History and Social History were reviewed in [employer] record.     Review of Systems:  Patient denies any headaches, hearing loss, fatigue, blurred vision, shortness of breath, chest pain, abdominal pain, problems with bowel movements, urination, or intercourse. No joint pain or mood swings.    Physical Exam:BP 108/63 (BP Location: Right Arm, Patient Position: Sitting, Cuff Size: Normal)   Pulse 82   Ht 5\' 7"  (1.702 m)   Wt 152 lb (68.9 kg)   LMP 04/10/2022   BMI 23.81 kg/m   General:  Well developed, well nourished, no acute distress Skin:  Warm and dry Neck:  Midline trachea, normal thyroid, good ROM, no lymphadenopathy Lungs; Clear to auscultation bilaterally Breast:  No dominant palpable mass, retraction, or nipple discharge Cardiovascular: Regular rate and rhythm Abdomen:  Soft, non tender, no hepatosplenomegaly Pelvic:  External genitalia is normal in appearance, no lesions.  The vagina is normal in appearance. Vaginal ring in place. Urethra has no lesions or masses. The cervix is bulbous.Pap with HR HPV genotyping performed.  Uterus is felt to be normal size, shape, and contour.  No adnexal masses or tenderness noted.Bladder is non tender, no masses felt. Extremities/musculoskeletal:  No swelling or varicosities noted, no clubbing or cyanosis Psych:  No mood changes, alert and cooperative,seems happy AA is 2 Fall risk is low    04/22/2022   12:52 PM 03/11/2020    4:19 PM 11/02/2018   12:11 PM  Depression screen PHQ 2/9  Decreased Interest 0 0 2  Down, Depressed, Hopeless 0 0 2  PHQ - 2 Score 0 0 4  Altered  sleeping 1 0 2  Tired, decreased energy 0 0 1  Change in appetite 0 0 2  Feeling bad or failure about yourself  0 0 1  Trouble concentrating 0 0 1  Moving slowly or fidgety/restless 0 0 0  Suicidal thoughts 0 0 0  PHQ-9 Score 1 0 11       04/22/2022   12:53 PM 03/11/2020    4:19 PM  GAD 7 : Generalized Anxiety Score  Nervous, Anxious, on Edge 0 0  Control/stop worrying 0 0  Worry too much - different things 0 0  Trouble relaxing 0 0  Restless 0 0  Easily annoyed or irritable 0 0  Afraid - awful might happen 0 0  Total GAD 7 Score 0 0        [facility] - 04/22/22 1310       Pregnancy Intention Screening   Does the patient want to become pregnant in the next year? No    Does the patient's partner want to become pregnant in the next year? No    Would the patient like to discuss contraceptive options today? No      Contraception Wrap Up   Current Method Vaginal Ring;Abstinence    End Method Abstinence;Vaginal Ring    Contraception Counseling Provided No            Pt gave verbal consent for exam without chaperone.  Impression and Plan: 1. Encounter for gynecological examination with Papanicolaou smear of cervix Pap sent Physical in  1 year Pap in 3 if normal Will let me know when wants labs Get mammogram after 40 - Cytology - PAP  2. Encounter for surveillance of vaginal ring hormonal contraceptive device Periods good with ring, will refill Meds ordered this encounter  Medications   etonogestrel-ethinyl estradiol (NUVARING) 0.12-0.015 MG/24HR vaginal ring    Sig: INSERT 1 RING VAGINALLY FOR 21 DAYS THEN REMOVE FOR 1 WEEK    Dispense:  3 each    Refill:  4    Order Specific Question:   Supervising Provider    Answer:   Duane Lope H [[id number]]

## 2022-04-25 ENCOUNTER — Other Ambulatory Visit (HOSPITAL_COMMUNITY): Payer: Self-pay | Admitting: Adult Health

## 2022-04-25 DIAGNOSIS — Z1231 Encounter for screening mammogram for malignant neoplasm of breast: Secondary | ICD-10-CM

## 2022-04-27 LAB — CYTOLOGY - PAP
Comment: NEGATIVE
Diagnosis: NEGATIVE
Diagnosis: REACTIVE
High risk HPV: NEGATIVE

## 2022-05-06 ENCOUNTER — Ambulatory Visit (HOSPITAL_COMMUNITY)
Admission: RE | Admit: 2022-05-06 | Discharge: 2022-05-06 | Disposition: A | Discharge status: Home / Self Care | Payer: No Typology Code available for payment source | Source: Ambulatory Visit | Attending: Adult Health | Admitting: Adult Health

## 2022-05-06 DIAGNOSIS — Z1231 Encounter for screening mammogram for malignant neoplasm of breast: Secondary | ICD-10-CM | POA: Diagnosis not present

## 2022-05-12 ENCOUNTER — Ambulatory Visit: Payer: Self-pay | Admitting: Adult Health

## 2022-06-10 ENCOUNTER — Ambulatory Visit: Payer: No Typology Code available for payment source | Admitting: Family Medicine

## 2022-07-08 ENCOUNTER — Telehealth: Payer: No Typology Code available for payment source | Admitting: Family

## 2022-07-08 DIAGNOSIS — K0889 Other specified disorders of teeth and supporting structures: Secondary | ICD-10-CM

## 2022-07-09 MED ORDER — AMOXICILLIN-POT CLAVULANATE 875-125 MG PO TABS: 1.0000 | ORAL_TABLET | Freq: Two times a day (BID) | ORAL | 0 refills | Status: DC

## 2022-07-09 NOTE — Progress Notes (Signed)

## 2022-07-27 ENCOUNTER — Other Ambulatory Visit (HOSPITAL_COMMUNITY): Payer: Self-pay

## 2022-08-25 ENCOUNTER — Other Ambulatory Visit (HOSPITAL_COMMUNITY): Payer: Self-pay

## 2022-08-26 ENCOUNTER — Other Ambulatory Visit: Payer: Self-pay

## 2022-10-05 ENCOUNTER — Other Ambulatory Visit (HOSPITAL_COMMUNITY): Payer: Self-pay

## 2023-03-23 ENCOUNTER — Other Ambulatory Visit: Payer: Self-pay

## 2023-03-23 ENCOUNTER — Encounter (HOSPITAL_COMMUNITY): Payer: Self-pay

## 2023-03-23 ENCOUNTER — Other Ambulatory Visit (HOSPITAL_COMMUNITY): Payer: Self-pay

## 2023-04-14 ENCOUNTER — Encounter: Payer: Self-pay | Admitting: Adult Health

## 2023-04-14 ENCOUNTER — Ambulatory Visit (INDEPENDENT_AMBULATORY_CARE_PROVIDER_SITE_OTHER): Payer: 59 | Admitting: Adult Health

## 2023-04-14 VITALS — BP 108/78 | HR 86 | Ht 67.0 in | Wt 164.0 lb

## 2023-04-14 DIAGNOSIS — Z01419 Encounter for gynecological examination (general) (routine) without abnormal findings: Secondary | ICD-10-CM | POA: Diagnosis not present

## 2023-04-14 DIAGNOSIS — Z13220 Encounter for screening for lipoid disorders: Secondary | ICD-10-CM | POA: Diagnosis not present

## 2023-04-14 DIAGNOSIS — Z30011 Encounter for initial prescription of contraceptive pills: Secondary | ICD-10-CM | POA: Diagnosis not present

## 2023-04-14 MED ORDER — LO LOESTRIN FE 1 MG-10 MCG / 10 MCG PO TABS: 1.0000 | ORAL_TABLET | Freq: Every day | ORAL | 0 refills | Status: DC

## 2023-04-14 NOTE — Progress Notes (Signed)
Patient ID: Gina Morgan, female   DOB: [birth year], 42 y.o.   MRN: [id number] History of Present Illness: Gina Morgan is a 42 year old white female,divorced, G1P1, in for a well woman gyn exam.     Component Value Date/Time   DIAGPAP  04/22/2022 1256    - Negative for Intraepithelial Lesions or Malignancy (NILM)   DIAGPAP - Benign reactive/reparative changes 04/22/2022 1256   DIAGPAP  03/11/2020 1621    - Negative for intraepithelial lesion or malignancy (NILM)   HPVHIGH Negative 04/22/2022 1256   HPVHIGH Negative 03/11/2020 1621   ADEQPAP  04/22/2022 1256    Satisfactory for evaluation; transformation zone component PRESENT.   ADEQPAP  03/11/2020 1621    Satisfactory for evaluation; transformation zone component PRESENT.   ADEQPAP  06/07/2017 0000    Satisfactory for evaluation  endocervical/transformation zone component PRESENT.     Current Medications, Allergies, Past Medical History, Past Surgical History, Family History and Social History were reviewed in [employer] record.     Review of Systems: Patient denies any headaches, hearing loss, fatigue, blurred vision, shortness of breath, chest pain, abdominal pain, problems with bowel movements, urination, or intercourse(not active). No joint pain or mood swings.  Stopped nuva ring had long periods.    Physical Exam:BP 108/78 (BP Location: Left Arm, Patient Position: Sitting, Cuff Size: Normal)   Pulse 86   Ht 5\' 7"  (1.702 m)   Wt 164 lb (74.4 kg)   LMP 03/25/2023   BMI 25.69 kg/m   General:  Well developed, well nourished, no acute distress Skin:  Warm and dry Neck:  Midline trachea, normal thyroid, good ROM, no lymphadenopathy Lungs; Clear to auscultation bilaterally Breast:  No dominant palpable mass, retraction, or nipple discharge Cardiovascular: Regular rate and rhythm Abdomen:  Soft, non tender, no hepatosplenomegaly Pelvic:  External genitalia is normal in appearance, no lesions.  The vagina  is normal in appearance. Urethra has no lesions or masses. The cervix is bulbous.  Uterus is felt to be normal size, shape, and contour.  No adnexal masses or tenderness noted.Bladder is non tender, no masses felt. Rectal:Deferred Extremities/musculoskeletal:  No swelling or varicosities noted, no clubbing or cyanosis Psych:  No mood changes, alert and cooperative,seems happy AA is 1 Fall risk is low    04/14/2023   11:51 AM 04/22/2022   12:52 PM 03/11/2020    4:19 PM  Depression screen PHQ 2/9  Decreased Interest 0 0 0  Down, Depressed, Hopeless 0 0 0  PHQ - 2 Score 0 0 0  Altered sleeping  1 0  Tired, decreased energy  0 0  Change in appetite  0 0  Feeling bad or failure about yourself   0 0  Trouble concentrating  0 0  Moving slowly or fidgety/restless  0 0  Suicidal thoughts  0 0  PHQ-9 Score  1 0       04/14/2023   11:51 AM 04/22/2022   12:53 PM 03/11/2020    4:19 PM  GAD 7 : Generalized Anxiety Score  Nervous, Anxious, on Edge 0 0 0  Control/stop worrying 0 0 0  Worry too much - different things 0 0 0  Trouble relaxing 0 0 0  Restless 0 0 0  Easily annoyed or irritable 0 0 0  Afraid - awful might happen 0 0 0  Total GAD 7 Score 0 0 0    Upstream - 04/14/23 1133       Pregnancy Intention Screening  Does the patient want to become pregnant in the next year? No    Does the patient's partner want to become pregnant in the next year? No    Would the patient like to discuss contraceptive options today? Yes      Contraception Wrap Up   Current Method Abstinence    End Method Abstinence;Oral Contraceptive    Contraception Counseling Provided Yes    How was the end contraceptive method provided? Provided on site              Pt gave verbal consent for exam without a chaperone.  Impression and Plan: 1. Encounter for well woman exam with routine gynecological exam Physical in 1 year Pap in 2026 Will check labs, will do fasting another day. Mammogram was negative  05/06/22 has scheduled for 05/12/23 - CBC - Comprehensive metabolic panel - Lipid panel  2. Screening cholesterol level - Lipid panel  3. Encounter for initial prescription of contraceptive pills Denies MI,stroke, DVT, breast cancer or migraine with aura Will try lo Loestrin, 3 packs given to start with next period. Will follow up in 3 months for ROS

## 2023-05-03 ENCOUNTER — Other Ambulatory Visit (HOSPITAL_COMMUNITY): Payer: Self-pay | Admitting: Adult Health

## 2023-05-03 DIAGNOSIS — Z1231 Encounter for screening mammogram for malignant neoplasm of breast: Secondary | ICD-10-CM

## 2023-05-12 ENCOUNTER — Ambulatory Visit (HOSPITAL_COMMUNITY)
Admission: RE | Admit: 2023-05-12 | Discharge: 2023-05-12 | Disposition: A | Discharge status: Home / Self Care | Payer: 59 | Source: Ambulatory Visit | Attending: Adult Health | Admitting: Adult Health

## 2023-05-12 ENCOUNTER — Inpatient Hospital Stay (HOSPITAL_COMMUNITY): Admission: RE | Admit: 2023-05-12 | Payer: 59 | Source: Ambulatory Visit

## 2023-05-12 ENCOUNTER — Encounter (HOSPITAL_COMMUNITY): Payer: Self-pay

## 2023-05-12 DIAGNOSIS — Z1231 Encounter for screening mammogram for malignant neoplasm of breast: Secondary | ICD-10-CM | POA: Diagnosis not present

## 2023-06-14 ENCOUNTER — Other Ambulatory Visit (HOSPITAL_COMMUNITY): Payer: Self-pay

## 2023-08-17 ENCOUNTER — Other Ambulatory Visit (HOSPITAL_COMMUNITY): Payer: Self-pay

## 2023-08-17 ENCOUNTER — Other Ambulatory Visit: Payer: Self-pay | Admitting: Adult Health

## 2023-08-17 MED ORDER — ESCITALOPRAM OXALATE 10 MG PO TABS
10.0000 mg | ORAL_TABLET | Freq: Every day | ORAL | 2 refills | Status: DC
  Filled 2023-08-17: qty 90, 90d supply, fill #0
  Filled 2023-11-10: qty 90, 90d supply, fill #1
  Filled 2024-02-14: qty 90, 90d supply, fill #2

## 2023-08-17 NOTE — Progress Notes (Signed)
Rx lexapro

## 2023-08-18 ENCOUNTER — Other Ambulatory Visit (HOSPITAL_COMMUNITY): Payer: Self-pay

## 2023-09-14 ENCOUNTER — Other Ambulatory Visit (HOSPITAL_COMMUNITY): Payer: Self-pay

## 2023-11-05 ENCOUNTER — Telehealth: Payer: 59 | Admitting: Family

## 2023-11-05 DIAGNOSIS — J019 Acute sinusitis, unspecified: Secondary | ICD-10-CM | POA: Diagnosis not present

## 2023-11-05 MED ORDER — AMOXICILLIN-POT CLAVULANATE 875-125 MG PO TABS: 1.0000 | ORAL_TABLET | Freq: Two times a day (BID) | ORAL | 0 refills | Status: DC

## 2023-11-05 NOTE — Progress Notes (Signed)

## 2023-11-10 ENCOUNTER — Other Ambulatory Visit: Payer: Self-pay

## 2023-12-09 ENCOUNTER — Telehealth: Admitting: Physician Assistant

## 2023-12-09 ENCOUNTER — Encounter: Payer: Self-pay | Admitting: Physician Assistant

## 2023-12-09 DIAGNOSIS — B001 Herpesviral vesicular dermatitis: Secondary | ICD-10-CM | POA: Diagnosis not present

## 2023-12-09 MED ORDER — VALACYCLOVIR HCL 1 G PO TABS: 2000.0000 mg | ORAL_TABLET | Freq: Two times a day (BID) | ORAL | 0 refills | Status: AC

## 2023-12-09 NOTE — Progress Notes (Signed)
 E-Visit for [name]   We are sorry that you are not feeling well.  Here is how we plan to help!   Based on what you have shared with me it does look like you have a viral infection.     Most cold sores or fever blisters are small fluid filled blisters around the mouth caused by herpes simplex virus.  The most common strain of the virus causing cold sores is herpes simplex virus 1.  It can be spread by skin contact, sharing eating utensils, or even sharing towels.  Cold sores are contagious to other people until dry. (Approximately 5-7 days).  Wash your hands. You can spread the virus to your eyes through handling your contact lenses after touching the lesions.   Most people experience pain at the sight or tingling sensations in their lips that may begin before the ulcers erupt.   Herpes simplex is treatable but not curable.  It may lie dormant for a long time and then reappear due to stress or prolonged sun exposure.  Many patients have success in treating their cold sores with an over the counter topical called Abreva.  You may apply the cream up to 5 times daily (maximum 10 days) until healing occurs.   If you would like to use an oral antiviral medication to speed the healing of your cold sore, I have sent a prescription to your local pharmacy Valacyclovir 2 gm take one by mouth twice a day for 1 day     HOME CARE:   Wash your hands frequently. Do not pick at or rub the sore. Don't open the blisters. Avoid kissing other people during this time. Avoid sharing drinking glasses, eating utensils, or razors. Do not handle contact lenses unless you have thoroughly washed your hands with soap and warm water! Avoid oral sex during this time.  Herpes from sores on your mouth can spread to your partner's genital area. Avoid contact with anyone who has eczema or a weakened immune system. Cold sores are often triggered by exposure to intense sunlight, use a lip balm containing a sunscreen (SPF 30  or higher).   GET HELP RIGHT AWAY IF:   Blisters look infected. Blisters occur near or in the eye. Symptoms last longer than 10 days. Your symptoms become worse.   MAKE SURE YOU:   Understand these instructions. Will watch your condition. Will get help right away if you are not doing well or get worse.     Your e-visit answers were reviewed by a board certified advanced clinical practitioner to complete your personal care plan.  Depending upon the condition, your plan could have  Included both over the counter or prescription medications.     Please review your pharmacy choice.  Be sure that the pharmacy you have chosen is open so that you can pick up your prescription now.  If there is a problem you can message your provider in MyChart to have the prescription routed to another pharmacy.     Your safety is important to [place].  If you have drug allergies check our prescription carefully.   For the next 24 hours you can use MyChart to ask questions about today's visit, request a non-urgent call back, or ask for a work or school excuse from your e-visit provider.   You will get an email in the next two days asking about your experience.  I hope that your e-visit has been valuable and will speed your recovery. I have  spent 5 minutes in review of e-visit questionnaire, review and updating patient chart, medical decision making and response to patient.   Kasandra Knudsen Mayers, PA-C

## 2024-01-31 ENCOUNTER — Ambulatory Visit: Admitting: Adult Health

## 2024-01-31 ENCOUNTER — Encounter: Payer: Self-pay | Admitting: Adult Health

## 2024-01-31 VITALS — BP 125/85 | HR 76

## 2024-01-31 DIAGNOSIS — S80862A Insect bite (nonvenomous), left lower leg, initial encounter: Secondary | ICD-10-CM | POA: Insufficient documentation

## 2024-01-31 DIAGNOSIS — L03116 Cellulitis of left lower limb: Secondary | ICD-10-CM

## 2024-01-31 DIAGNOSIS — W57XXXA Bitten or stung by nonvenomous insect and other nonvenomous arthropods, initial encounter: Secondary | ICD-10-CM

## 2024-01-31 MED ORDER — AMOXICILLIN-POT CLAVULANATE 875-125 MG PO TABS: 1.0000 | ORAL_TABLET | Freq: Two times a day (BID) | ORAL | 0 refills | Status: DC

## 2024-01-31 MED ORDER — FLUCONAZOLE 150 MG PO TABS: ORAL_TABLET | ORAL | 1 refills | Status: DC

## 2024-01-31 MED ORDER — PREDNISONE 10 MG PO TABS: ORAL_TABLET | ORAL | 0 refills | Status: DC

## 2024-01-31 MED ORDER — BETAMETHASONE SOD PHOS & ACET 6 (3-3) MG/ML IJ SUSP
12.0000 mg | Freq: Once | INTRAMUSCULAR | Status: AC
  Administered 2024-01-31: 12 mg via INTRAMUSCULAR

## 2024-01-31 NOTE — Progress Notes (Signed)
 Subjective:     Patient ID: Gina Morgan, female   DOB: 1981-08-17, 43 y.o.   MRN: [id number]  HPI Gina Morgan is a 43 year old white female, divorced, G1P1001, in for several ?bug bites on left leg, 2 on thigh, 1 on ankle and 1 on back of left calf, started out itching, and now the one on calf is red and warm to touch and had blisters in center, the red area is 10 x 5.5 cm. The area left ankle is about 1 cm and there is also a blister there. Noticed the calf yesterday and was itchy, but noticed blisters today and drained on pant leg.     Component Value Date/Time   DIAGPAP  04/22/2022 1256    - Negative for Intraepithelial Lesions or Malignancy (NILM)   DIAGPAP - Benign reactive/reparative changes 04/22/2022 1256   DIAGPAP  03/11/2020 1621    - Negative for intraepithelial lesion or malignancy (NILM)   HPVHIGH Negative 04/22/2022 1256   HPVHIGH Negative 03/11/2020 1621   ADEQPAP  04/22/2022 1256    Satisfactory for evaluation; transformation zone component PRESENT.   ADEQPAP  03/11/2020 1621    Satisfactory for evaluation; transformation zone component PRESENT.   ADEQPAP  06/07/2017 0000    Satisfactory for evaluation  endocervical/transformation zone component PRESENT.    Review of Systems ?bug bites Red, warm and itchy and now has blisters    Reviewed past medical,surgical, social and family history. Reviewed medications and allergies.  Objective:   Physical Exam BP 125/85 (BP Location: Left Arm, Patient Position: Sitting, Cuff Size: Normal)   Pulse 76   LMP 01/26/2024 (Exact Date)      Skin is warm and dry. Has 2 raised red areas left thigh, has 1 cm area left ankle that is red and has one blister, and on left calf, area is red, and warm, measures 10 x 5.5 cm and has several blisters, drained on pant leg.   [facility] - 01/31/24 1530       Pregnancy Intention Screening   Does the patient want to become pregnant in the next year? No    Does the patient's partner want to become  pregnant in the next year? No    Would the patient like to discuss contraceptive options today? No      Contraception Wrap Up   Current Method Abstinence;Oral Contraceptive    End Method Abstinence;Oral Contraceptive    Contraception Counseling Provided Yes             Assessment:    1. Insect bite of left lower leg, initial encounter Has several red areas on left leg   2. Cellulitis of left lower extremity (Primary) Left calf, area is red, and warm, measures 10 x 5.5 cm and has several blisters, drained on pant leg. Called and discussed with Dr Gina Morgan Will rx Augmentin  875-125 mg 1 bid x 10 days, prednisone 40 mg daily for 10 days and diflucan  to prevent yeast, will give 12 mg betamethasone IM in office, done by Gina Aschoff LPN   Meds ordered this encounter  Medications   amoxicillin -clavulanate (AUGMENTIN ) 875-125 MG tablet    Sig: Take 1 tablet by mouth 2 (two) times daily.    Dispense:  20 tablet    Refill:  0    Supervising Provider:   Evalyn Morgan H [[id number]]   fluconazole  (DIFLUCAN ) 150 MG tablet    Sig: Take 1 now and 1 in 3 days    Dispense:  2  tablet    Refill:  1    Supervising Provider:   Evalyn Morgan H [[id number]]   predniSONE (DELTASONE) 10 MG tablet    Sig: Take 4 tablets daily for 10 days    Dispense:  40 tablet    Refill:  0    Supervising Provider:   Evalyn Morgan H [[id number]]   betamethasone acetate-betamethasone sodium phosphate (CELESTONE) injection 12 mg    [facility]stream - 01/31/24 1530       Pregnancy Intention Screening   Does the patient want to become pregnant in the next year? No    Does the patient's partner want to become pregnant in the next year? No    Would the patient like to discuss contraceptive options today? No      Contraception Wrap Up   Current Method Abstinence;Oral Contraceptiv   End Method Abstinence;Oral Contraceptive    Contraception Counseling Provided            Plan:     Follow up in 1 week or sooner if needed

## 2024-02-07 ENCOUNTER — Ambulatory Visit: Admitting: Adult Health

## 2024-02-17 ENCOUNTER — Other Ambulatory Visit (HOSPITAL_COMMUNITY): Payer: Self-pay

## 2024-04-19 ENCOUNTER — Ambulatory Visit: Admitting: Adult Health

## 2024-04-24 ENCOUNTER — Telehealth: Admitting: Physician Assistant

## 2024-04-24 DIAGNOSIS — M549 Dorsalgia, unspecified: Secondary | ICD-10-CM

## 2024-04-24 MED ORDER — CYCLOBENZAPRINE HCL 10 MG PO TABS: 5.0000 mg | ORAL_TABLET | Freq: Three times a day (TID) | ORAL | 0 refills | Status: DC | PRN

## 2024-04-24 NOTE — Progress Notes (Signed)
 E-Visit for Back Pain   We are sorry that you are not feeling well.  Here is how we plan to help!  Based on what you have shared with me it looks like you mostly have acute back pain.  Acute back pain is defined as musculoskeletal pain that can resolve in 1-3 weeks with conservative treatment.  I have prescribed Flexeril  10 mg Take 0.5-1 tablet every eight hours as needed, which is a muscle relaxer. Please keep in mind that muscle relaxer's can cause fatigue and should not be taken while at work or driving.  Back pain is very common.  The pain often gets better over time.  The cause of back pain is usually not dangerous.  Most people can learn to manage their back pain on their own.  Home Care Stay active.  Start with short walks on flat ground if you can.  Try to walk farther each day. Do not sit, drive or stand in one place for more than 30 minutes.  Do not stay in bed. Do not avoid exercise or work.  Activity can help your back heal faster. Be careful when you bend or lift an object.  Bend at your knees, keep the object close to you, and do not twist. Sleep on a firm mattress.  Lie on your side, and bend your knees.  If you lie on your back, put a pillow under your knees. Only take medicines as told by your doctor. Put ice on the injured area. Put ice in a plastic bag Place a towel between your skin and the bag Leave the ice on for 15-20 minutes, 3-4 times a day for the first 2-3 days. 210 After that, you can switch between ice and heat packs. Ask your doctor about back exercises or massage. Avoid feeling anxious or stressed.  Find good ways to deal with stress, such as exercise.  Get Help Right Way If: Your pain does not go away with rest or medicine. Your pain does not go away in 1 week. You have new problems. You do not feel well. The pain spreads into your legs. You cannot control when you poop (bowel movement) or pee (urinate) You feel sick to your stomach (nauseous) or throw  up (vomit) You have belly (abdominal) pain. You feel like you may pass out (faint). If you develop a fever.  Make Sure you: Understand these instructions. Will watch your condition Will get help right away if you are not doing well or get worse.  Your e-visit answers were reviewed by a board certified advanced clinical practitioner to complete your personal care plan.  Depending on the condition, your plan could have included both over the counter or prescription medications.  If there is a problem please reply  once you have received a response from your provider.  Your safety is important to us .  If you have drug allergies check your prescription carefully.    You can use MyChart to ask questions about today's visit, request a non-urgent call back, or ask for a work or school excuse for 24 hours related to this e-Visit. If it has been greater than 24 hours you will need to follow up with your provider, or enter a new e-Visit to address those concerns.  You will get an e-mail in the next two days asking about your experience.  I hope that your e-visit has been valuable and will speed your recovery. Thank you for using e-visits.    I have spent  5 minutes in review of e-visit questionnaire, review and updating patient chart, medical decision making and response to patient.   Delon CHRISTELLA Dickinson, PA-C

## 2024-05-08 ENCOUNTER — Other Ambulatory Visit: Payer: Self-pay | Admitting: Medical Genetics

## 2024-05-09 ENCOUNTER — Other Ambulatory Visit (HOSPITAL_COMMUNITY): Payer: Self-pay | Admitting: Adult Health

## 2024-05-09 DIAGNOSIS — Z1231 Encounter for screening mammogram for malignant neoplasm of breast: Secondary | ICD-10-CM

## 2024-05-10 ENCOUNTER — Other Ambulatory Visit (HOSPITAL_COMMUNITY): Payer: Self-pay

## 2024-05-10 ENCOUNTER — Encounter: Payer: Self-pay | Admitting: Adult Health

## 2024-05-10 ENCOUNTER — Ambulatory Visit: Admitting: Adult Health

## 2024-05-10 VITALS — BP 107/73 | HR 81 | Ht 67.0 in | Wt 162.0 lb

## 2024-05-10 DIAGNOSIS — Z01419 Encounter for gynecological examination (general) (routine) without abnormal findings: Secondary | ICD-10-CM

## 2024-05-10 DIAGNOSIS — F419 Anxiety disorder, unspecified: Secondary | ICD-10-CM | POA: Diagnosis not present

## 2024-05-10 MED ORDER — ESCITALOPRAM OXALATE 10 MG PO TABS
10.0000 mg | ORAL_TABLET | Freq: Every day | ORAL | 3 refills | Status: AC
  Filled 2024-05-10: qty 90, 90d supply, fill #0
  Filled 2024-08-10: qty 90, 90d supply, fill #1

## 2024-05-10 NOTE — Progress Notes (Signed)
 Patient ID: Gina Morgan, female   DOB: Sep 26, 1980, 43 y.o.   MRN: [id number] History of Present Illness: Gina Morgan is a 43 year old white female, divorced, G1P1001, in for a well woman gyn exam. She stopped BCP, had headaches before periods and they have stopped.     Component Value Date/Time   DIAGPAP  04/22/2022 1256    - Negative for Intraepithelial Lesions or Malignancy (NILM)   DIAGPAP - Benign reactive/reparative changes 04/22/2022 1256   DIAGPAP  03/11/2020 1621    - Negative for intraepithelial lesion or malignancy (NILM)   HPVHIGH Negative 04/22/2022 1256   HPVHIGH Negative 03/11/2020 1621   ADEQPAP  04/22/2022 1256    Satisfactory for evaluation; transformation zone component PRESENT.   ADEQPAP  03/11/2020 1621    Satisfactory for evaluation; transformation zone component PRESENT.   ADEQPAP  06/07/2017 0000    Satisfactory for evaluation  endocervical/transformation zone component PRESENT.      Current Medications, Allergies, Past Medical History, Past Surgical History, Family History and Social History were reviewed in [employer] record.     Review of Systems: Patient denies any headaches, hearing loss, fatigue, blurred vision, shortness of breath, chest pain, abdominal pain, problems with bowel movements, urination(may get up 1-2 x at night), or intercourse.(Not active). No joint pain or mood swings.     Physical Exam:BP 107/73 (BP Location: Right Arm, Patient Position: Sitting, Cuff Size: Normal)   Pulse 81   Ht 5' 7 (1.702 m)   Wt 162 lb (73.5 kg)   LMP 04/17/2024 (Exact Date)   BMI 25.37 kg/m   General:  Well developed, well nourished, no acute distress Skin:  Warm and dry Neck:  Midline trachea, normal thyroid , good ROM, no lymphadenopathy Lungs; Clear to auscultation bilaterally Breast:  No dominant palpable mass, retraction, or nipple discharge Cardiovascular: Regular rate and rhythm Abdomen:  Soft, non tender, no  hepatosplenomegaly Pelvic:  External genitalia is normal in appearance, no lesions.  The vagina is normal in appearance. Urethra has no lesions or masses. The cervix is bulbous and smooth.  Uterus is felt to be normal size, shape, and contour.  No adnexal masses or tenderness noted.Bladder is non tender, no masses felt. Rectal: Deferred  Extremities/musculoskeletal:  No swelling or varicosities noted, no clubbing or cyanosis Psych:  No mood changes, alert and cooperative,seems happy AA is 1 Fall risk is low    05/10/2024   11:19 AM 04/14/2023   11:51 AM 04/22/2022   12:52 PM  Depression screen PHQ 2/9  Decreased Interest 0 0 0  Down, Depressed, Hopeless 0 0 0  PHQ - 2 Score 0 0 0  Altered sleeping 0  1  Tired, decreased energy 0  0  Change in appetite 0  0  Feeling bad or failure about yourself  0  0  Trouble concentrating 0  0  Moving slowly or fidgety/restless 0  0  Suicidal thoughts 0  0  PHQ-9 Score 0  1       05/10/2024   11:19 AM 04/14/2023   11:51 AM 04/22/2022   12:53 PM 03/11/2020    4:19 PM  GAD 7 : Generalized Anxiety Score  Nervous, Anxious, on Edge 1 0 0 0  Control/stop worrying 0 0 0 0  Worry too much - different things 1 0 0 0  Trouble relaxing 1 0 0 0  Restless 0 0 0 0  Easily annoyed or irritable 1 0 0 0  Afraid - awful  might happen 0 0 0 0  Total GAD 7 Score 4 0 0 0    Upstream - 05/10/24 1142       Pregnancy Intention Screening   Does the patient want to become pregnant in the next year? No    Does the patient's partner want to become pregnant in the next year? No    Would the patient like to discuss contraceptive options today? No      Contraception Wrap Up   Current Method Abstinence    End Method Abstinence    Contraception Counseling Provided No         Pt gave verbal consent for exam without chaperone.    Impression and plan: 1. Encounter for well woman exam with routine gynecological exam (Primary) Physical in 1 year Pap in 2026 Mammogram  was negative 05/12/23 and scheduled for 05/17/24 May get labs in future Stay active   2. Anxiety Happy with lexapro  10 mg 1 daily, will refill Meds ordered this encounter  Medications   escitalopram  (LEXAPRO ) 10 MG tablet    Sig: Take 1 tablet (10 mg total) by mouth daily.    Dispense:  90 tablet    Refill:  3    Supervising Provider:   JAYNE MINDER H [[id number]]

## 2024-05-17 ENCOUNTER — Encounter (HOSPITAL_COMMUNITY)

## 2024-05-17 ENCOUNTER — Other Ambulatory Visit (HOSPITAL_COMMUNITY)

## 2024-05-17 DIAGNOSIS — Z1231 Encounter for screening mammogram for malignant neoplasm of breast: Secondary | ICD-10-CM

## 2024-05-28 ENCOUNTER — Other Ambulatory Visit (HOSPITAL_COMMUNITY): Payer: Self-pay | Admitting: Adult Health

## 2024-05-28 ENCOUNTER — Encounter (HOSPITAL_COMMUNITY): Payer: Self-pay | Admitting: Radiology

## 2024-05-28 DIAGNOSIS — Z1231 Encounter for screening mammogram for malignant neoplasm of breast: Secondary | ICD-10-CM

## 2024-05-31 ENCOUNTER — Encounter (HOSPITAL_COMMUNITY): Payer: Self-pay

## 2024-05-31 ENCOUNTER — Ambulatory Visit (HOSPITAL_COMMUNITY)
Admission: RE | Admit: 2024-05-31 | Discharge: 2024-05-31 | Disposition: A | Discharge status: Home / Self Care | Source: Ambulatory Visit | Attending: Adult Health | Admitting: Adult Health

## 2024-05-31 DIAGNOSIS — Z1231 Encounter for screening mammogram for malignant neoplasm of breast: Secondary | ICD-10-CM | POA: Diagnosis not present

## 2024-06-04 ENCOUNTER — Ambulatory Visit: Payer: Self-pay | Admitting: Adult Health

## 2024-06-21 ENCOUNTER — Other Ambulatory Visit: Payer: Self-pay | Admitting: *Deleted

## 2024-06-21 DIAGNOSIS — Z006 Encounter for examination for normal comparison and control in clinical research program: Secondary | ICD-10-CM

## 2024-07-20 LAB — GENECONNECT MOLECULAR SCREEN: Genetic Analysis Overall Interpretation: NEGATIVE

## 2024-08-13 ENCOUNTER — Other Ambulatory Visit: Payer: Self-pay

## 2024-08-14 ENCOUNTER — Other Ambulatory Visit (HOSPITAL_COMMUNITY): Payer: Self-pay

## 2024-10-18 ENCOUNTER — Telehealth: Payer: Self-pay | Admitting: Adult Health

## 2024-10-18 MED ORDER — FLUCONAZOLE 150 MG PO TABS: ORAL_TABLET | ORAL | 1 refills | Status: AC

## 2024-10-18 NOTE — Telephone Encounter (Signed)
 Pt requests diflucan , rx sent
